# Patient Record
Sex: Male | Born: 1980 | Race: White | Hispanic: No | Marital: Single | Smoking: Former smoker
Health system: Southern US, Community
[De-identification: ages and names within clinical notes are randomized; demographics above are authoritative.]

## PROBLEM LIST (undated history)

## (undated) HISTORY — PX: BACK SURGERY: SHX140

---

## 2016-03-13 ENCOUNTER — Encounter (HOSPITAL_COMMUNITY): Payer: Self-pay | Admitting: Emergency Medicine

## 2016-03-13 ENCOUNTER — Emergency Department (HOSPITAL_COMMUNITY): Payer: Worker's Compensation | Admitting: Anesthesiology

## 2016-03-13 ENCOUNTER — Inpatient Hospital Stay (HOSPITAL_COMMUNITY)
Admission: EM | Admit: 2016-03-13 | Discharge: 2016-03-15 | DRG: 252 | Disposition: A | Discharge status: Home / Self Care | Payer: Worker's Compensation | Attending: Vascular Surgery | Admitting: Vascular Surgery

## 2016-03-13 ENCOUNTER — Emergency Department (HOSPITAL_COMMUNITY): Payer: Worker's Compensation

## 2016-03-13 ENCOUNTER — Encounter (HOSPITAL_COMMUNITY)
Admission: EM | Disposition: A | Discharge status: Home / Self Care | Payer: Self-pay | Source: Home / Self Care | Attending: Vascular Surgery

## 2016-03-13 DIAGNOSIS — W12XXXA Fall on and from scaffolding, initial encounter: Secondary | ICD-10-CM | POA: Diagnosis present

## 2016-03-13 DIAGNOSIS — R200 Anesthesia of skin: Secondary | ICD-10-CM | POA: Diagnosis present

## 2016-03-13 DIAGNOSIS — F17200 Nicotine dependence, unspecified, uncomplicated: Secondary | ICD-10-CM | POA: Diagnosis present

## 2016-03-13 DIAGNOSIS — I7776 Dissection of artery of upper extremity: Secondary | ICD-10-CM | POA: Diagnosis not present

## 2016-03-13 DIAGNOSIS — S143XXA Injury of brachial plexus, initial encounter: Secondary | ICD-10-CM | POA: Diagnosis not present

## 2016-03-13 DIAGNOSIS — S46212A Strain of muscle, fascia and tendon of other parts of biceps, left arm, initial encounter: Secondary | ICD-10-CM | POA: Diagnosis not present

## 2016-03-13 DIAGNOSIS — S4992XA Unspecified injury of left shoulder and upper arm, initial encounter: Secondary | ICD-10-CM

## 2016-03-13 DIAGNOSIS — S45092A Other specified injury of axillary artery, left side, initial encounter: Secondary | ICD-10-CM | POA: Diagnosis not present

## 2016-03-13 DIAGNOSIS — S45009A Unspecified injury of axillary artery, unspecified side, initial encounter: Secondary | ICD-10-CM | POA: Diagnosis present

## 2016-03-13 DIAGNOSIS — S45119A Laceration of brachial artery, unspecified side, initial encounter: Secondary | ICD-10-CM | POA: Diagnosis present

## 2016-03-13 DIAGNOSIS — S4990XA Unspecified injury of shoulder and upper arm, unspecified arm, initial encounter: Secondary | ICD-10-CM | POA: Diagnosis present

## 2016-03-13 HISTORY — PX: AXILLARY-FEMORAL BYPASS GRAFT: SHX894

## 2016-03-13 LAB — CBC WITH DIFFERENTIAL/PLATELET
BASOS ABS: 0.1 10*3/uL (ref 0.0–0.1)
Basophils Relative: 1 %
EOS PCT: 0 %
Eosinophils Absolute: 0.1 10*3/uL (ref 0.0–0.7)
HCT: 42.6 % (ref 39.0–52.0)
HEMOGLOBIN: 14.6 g/dL (ref 13.0–17.0)
LYMPHS PCT: 27 %
Lymphs Abs: 4.2 10*3/uL — ABNORMAL HIGH (ref 0.7–4.0)
MCH: 33.3 pg (ref 26.0–34.0)
MCHC: 34.3 g/dL (ref 30.0–36.0)
MCV: 97 fL (ref 78.0–100.0)
Monocytes Absolute: 1.2 10*3/uL — ABNORMAL HIGH (ref 0.1–1.0)
Monocytes Relative: 8 %
NEUTROS PCT: 64 %
Neutro Abs: 10.1 10*3/uL — ABNORMAL HIGH (ref 1.7–7.7)
Platelets: 268 10*3/uL (ref 150–400)
RBC: 4.39 MIL/uL (ref 4.22–5.81)
RDW: 14.6 % (ref 11.5–15.5)
WBC: 15.7 10*3/uL — AB (ref 4.0–10.5)

## 2016-03-13 LAB — I-STAT CHEM 8, ED
BUN: 14 mg/dL (ref 6–20)
CHLORIDE: 106 mmol/L (ref 101–111)
Calcium, Ion: 1.13 mmol/L (ref 1.13–1.30)
Creatinine, Ser: 0.8 mg/dL (ref 0.61–1.24)
GLUCOSE: 100 mg/dL — AB (ref 65–99)
HEMATOCRIT: 39 % (ref 39.0–52.0)
HEMOGLOBIN: 13.3 g/dL (ref 13.0–17.0)
POTASSIUM: 3.9 mmol/L (ref 3.5–5.1)
SODIUM: 139 mmol/L (ref 135–145)
TCO2: 26 mmol/L (ref 0–100)

## 2016-03-13 LAB — TYPE AND SCREEN
ABO/RH(D): A POS
ANTIBODY SCREEN: NEGATIVE

## 2016-03-13 LAB — ABO/RH: ABO/RH(D): A POS

## 2016-03-13 SURGERY — CREATION, BYPASS, ARTERIAL, AXILLARY TO BILATERAL FEMORAL, USING GRAFT
Anesthesia: General | Laterality: Left

## 2016-03-13 MED ORDER — FENTANYL CITRATE (PF) 100 MCG/2ML IJ SOLN
INTRAMUSCULAR | Status: AC
  Filled 2016-03-13: qty 2

## 2016-03-13 MED ORDER — ONDANSETRON HCL 4 MG/2ML IJ SOLN
INTRAMUSCULAR | Status: AC
  Filled 2016-03-13: qty 2

## 2016-03-13 MED ORDER — PROMETHAZINE HCL 25 MG/ML IJ SOLN
6.2500 mg | INTRAMUSCULAR | Status: DC | PRN
  Administered 2016-03-13: 6.25 mg via INTRAVENOUS

## 2016-03-13 MED ORDER — IOPAMIDOL (ISOVUE-370) INJECTION 76%
INTRAVENOUS | Status: AC
  Administered 2016-03-13: 100 mL
  Filled 2016-03-13: qty 100

## 2016-03-13 MED ORDER — HYDROMORPHONE HCL 1 MG/ML IJ SOLN
1.0000 mg | INTRAMUSCULAR | Status: AC
  Administered 2016-03-13: 1 mg via INTRAVENOUS
  Filled 2016-03-13: qty 1

## 2016-03-13 MED ORDER — HYDROMORPHONE HCL 1 MG/ML IJ SOLN
1.0000 mg | Freq: Once | INTRAMUSCULAR | Status: AC
  Administered 2016-03-13: 1 mg via INTRAVENOUS
  Filled 2016-03-13: qty 1

## 2016-03-13 MED ORDER — PROPOFOL 10 MG/ML IV BOLUS
INTRAVENOUS | Status: DC | PRN
  Administered 2016-03-13: 200 mg via INTRAVENOUS

## 2016-03-13 MED ORDER — MEPERIDINE HCL 25 MG/ML IJ SOLN: 6.2500 mg | INTRAMUSCULAR | Status: DC | PRN

## 2016-03-13 MED ORDER — SODIUM CHLORIDE 0.9 % IV SOLN
INTRAVENOUS | Status: DC | PRN
  Administered 2016-03-13: 500 mL

## 2016-03-13 MED ORDER — CEFAZOLIN SODIUM-DEXTROSE 2-3 GM-% IV SOLR
INTRAVENOUS | Status: DC | PRN
  Administered 2016-03-13: 2 g via INTRAVENOUS

## 2016-03-13 MED ORDER — PROMETHAZINE HCL 25 MG/ML IJ SOLN
INTRAMUSCULAR | Status: AC
  Filled 2016-03-13: qty 1

## 2016-03-13 MED ORDER — ONDANSETRON HCL 4 MG/2ML IJ SOLN
4.0000 mg | Freq: Once | INTRAMUSCULAR | Status: AC
  Administered 2016-03-13: 4 mg via INTRAVENOUS
  Filled 2016-03-13: qty 2

## 2016-03-13 MED ORDER — MIDAZOLAM HCL 2 MG/2ML IJ SOLN
INTRAMUSCULAR | Status: DC | PRN
  Administered 2016-03-13: 2 mg via INTRAVENOUS

## 2016-03-13 MED ORDER — HEPARIN SODIUM (PORCINE) 1000 UNIT/ML IJ SOLN
INTRAMUSCULAR | Status: AC
  Filled 2016-03-13: qty 1

## 2016-03-13 MED ORDER — MIDAZOLAM HCL 2 MG/2ML IJ SOLN
INTRAMUSCULAR | Status: AC
  Filled 2016-03-13: qty 2

## 2016-03-13 MED ORDER — LIDOCAINE HCL (CARDIAC) 20 MG/ML IV SOLN
INTRAVENOUS | Status: DC | PRN
  Administered 2016-03-13: 100 mg via INTRATRACHEAL

## 2016-03-13 MED ORDER — HEMOSTATIC AGENTS (NO CHARGE) OPTIME
TOPICAL | Status: DC | PRN
  Administered 2016-03-13: 1 via TOPICAL

## 2016-03-13 MED ORDER — FENTANYL CITRATE (PF) 100 MCG/2ML IJ SOLN
INTRAMUSCULAR | Status: DC | PRN
  Administered 2016-03-13: 100 ug via INTRAVENOUS
  Administered 2016-03-13 (×2): 50 ug via INTRAVENOUS
  Administered 2016-03-13: 100 ug via INTRAVENOUS
  Administered 2016-03-13 (×2): 50 ug via INTRAVENOUS

## 2016-03-13 MED ORDER — 0.9 % SODIUM CHLORIDE (POUR BTL) OPTIME
TOPICAL | Status: DC | PRN
  Administered 2016-03-13: 2000 mL

## 2016-03-13 MED ORDER — PROTAMINE SULFATE 10 MG/ML IV SOLN
INTRAVENOUS | Status: DC | PRN
  Administered 2016-03-13: 50 mg via INTRAVENOUS

## 2016-03-13 MED ORDER — LACTATED RINGERS IV SOLN
INTRAVENOUS | Status: DC | PRN
  Administered 2016-03-13 (×2): via INTRAVENOUS

## 2016-03-13 MED ORDER — HYDROMORPHONE HCL 1 MG/ML IJ SOLN
INTRAMUSCULAR | Status: AC
  Filled 2016-03-13: qty 1

## 2016-03-13 MED ORDER — ONDANSETRON HCL 4 MG/2ML IJ SOLN
INTRAMUSCULAR | Status: DC | PRN
  Administered 2016-03-13: 4 mg via INTRAVENOUS

## 2016-03-13 MED ORDER — HEPARIN SODIUM (PORCINE) 1000 UNIT/ML IJ SOLN
INTRAMUSCULAR | Status: DC | PRN
  Administered 2016-03-13: 9000 [IU] via INTRAVENOUS
  Administered 2016-03-13: 2000 [IU] via INTRAVENOUS

## 2016-03-13 MED ORDER — HYDROMORPHONE HCL 1 MG/ML IJ SOLN
0.2500 mg | INTRAMUSCULAR | Status: DC | PRN
  Administered 2016-03-13 – 2016-03-14 (×4): 0.5 mg via INTRAVENOUS

## 2016-03-13 MED ORDER — SUCCINYLCHOLINE CHLORIDE 20 MG/ML IJ SOLN
INTRAMUSCULAR | Status: DC | PRN
  Administered 2016-03-13: 100 mg via INTRAVENOUS

## 2016-03-13 MED ORDER — PROTAMINE SULFATE 10 MG/ML IV SOLN
INTRAVENOUS | Status: AC
  Filled 2016-03-13: qty 5

## 2016-03-13 SURGICAL SUPPLY — 47 items
CANISTER SUCTION 2500CC (MISCELLANEOUS) ×3 IMPLANT
CLIP TI MEDIUM 24 (CLIP) ×3 IMPLANT
CLIP TI WIDE RED SMALL 24 (CLIP) ×3 IMPLANT
CLOSURE WOUND 1/2 X4 (GAUZE/BANDAGES/DRESSINGS)
DERMABOND ADVANCED (GAUZE/BANDAGES/DRESSINGS) ×4
DERMABOND ADVANCED .7 DNX12 (GAUZE/BANDAGES/DRESSINGS) ×2 IMPLANT
DRAIN SNY 10X20 3/4 PERF (WOUND CARE) IMPLANT
DRAPE INCISE IOBAN 66X45 STRL (DRAPES) ×3 IMPLANT
DRAPE UTILITY W/TAPE 26X15 (DRAPES) ×3 IMPLANT
DRSG COVADERM 4X6 (GAUZE/BANDAGES/DRESSINGS) ×3 IMPLANT
DRSG COVADERM 4X8 (GAUZE/BANDAGES/DRESSINGS) ×3 IMPLANT
ELECT REM PT RETURN 9FT ADLT (ELECTROSURGICAL) ×3
ELECTRODE REM PT RTRN 9FT ADLT (ELECTROSURGICAL) ×1 IMPLANT
EVACUATOR SILICONE 100CC (DRAIN) IMPLANT
GAUZE SPONGE 4X4 12PLY STRL (GAUZE/BANDAGES/DRESSINGS) ×3 IMPLANT
GAUZE SPONGE 4X4 16PLY XRAY LF (GAUZE/BANDAGES/DRESSINGS) IMPLANT
GLOVE BIO SURGEON STRL SZ7 (GLOVE) ×3 IMPLANT
GLOVE BIOGEL PI IND STRL 6.5 (GLOVE) ×4 IMPLANT
GLOVE BIOGEL PI IND STRL 7.5 (GLOVE) ×2 IMPLANT
GLOVE BIOGEL PI INDICATOR 6.5 (GLOVE) ×8
GLOVE BIOGEL PI INDICATOR 7.5 (GLOVE) ×4
GLOVE OPTIFIT SS 6.5 STRL BRWN (GLOVE) ×3 IMPLANT
GLOVE SURG SS PI 7.5 STRL IVOR (GLOVE) ×3 IMPLANT
GOWN STRL REUS W/ TWL LRG LVL3 (GOWN DISPOSABLE) ×3 IMPLANT
GOWN STRL REUS W/TWL LRG LVL3 (GOWN DISPOSABLE) ×6
HEMOSTAT SPONGE AVITENE ULTRA (HEMOSTASIS) ×3 IMPLANT
INSERT FOGARTY SM (MISCELLANEOUS) IMPLANT
KIT BASIN OR (CUSTOM PROCEDURE TRAY) ×3 IMPLANT
KIT ROOM TURNOVER OR (KITS) ×3 IMPLANT
NS IRRIG 1000ML POUR BTL (IV SOLUTION) ×6 IMPLANT
PACK PERIPHERAL VASCULAR (CUSTOM PROCEDURE TRAY) ×3 IMPLANT
PAD ARMBOARD 7.5X6 YLW CONV (MISCELLANEOUS) ×6 IMPLANT
STAPLER VISISTAT 35W (STAPLE) IMPLANT
STRIP CLOSURE SKIN 1/2X4 (GAUZE/BANDAGES/DRESSINGS) IMPLANT
SUT CHROMIC 2 0 SH (SUTURE) ×3 IMPLANT
SUT MNCRL AB 4-0 PS2 18 (SUTURE) ×3 IMPLANT
SUT PROLENE 5 0 C 1 24 (SUTURE) ×3 IMPLANT
SUT PROLENE 6 0 BV (SUTURE) ×9 IMPLANT
SUT SILK 2 0 FS (SUTURE) IMPLANT
SUT SILK 3 0 (SUTURE)
SUT SILK 3-0 18XBRD TIE 12 (SUTURE) IMPLANT
SUT VIC AB 2-0 CT1 27 (SUTURE) ×6
SUT VIC AB 2-0 CT1 TAPERPNT 27 (SUTURE) ×3 IMPLANT
SUT VIC AB 3-0 SH 27 (SUTURE) ×2
SUT VIC AB 3-0 SH 27X BRD (SUTURE) ×1 IMPLANT
TRAY FOLEY W/METER SILVER 16FR (SET/KITS/TRAYS/PACK) IMPLANT
WATER STERILE IRR 1000ML POUR (IV SOLUTION) ×3 IMPLANT

## 2016-03-13 NOTE — H&P (View-Only) (Signed)
Referred by:  Dr. Dalene SeltzerSchlossman ([facility])  Reason for referral: left axillary artery dissection with thrombosis   History of Present Illness  Oscar BandaStanley Peterson is a 35 y.o. (20-Nov-1980) male RHD who presents with chief complaint: severe pain left armpit.  This patient fell on to a scaffolding rail around 11 am, catching it with his left armpit.  He held onto the scaffolding while it proceeded to collapses.  He notes severe sharp pain in left arm pit with numbness from elbow down to hand.  Pt denies any prior left arm injuries.    Past Medical History: usually healthy  Past Surgical History:  Procedure Laterality Date  . BACK SURGERY      Social History   Social History  . Marital status: Single    Spouse name: N/A  . Number of children: N/A  . Years of education: N/A   Occupational History  . Not on file.   Social History Main Topics  . Smoking status: Current Every Day Smoker  . Smokeless tobacco: Never Used  . Alcohol use No  . Drug use: No  . Sexual activity: Not on file   Other Topics Concern  . Not on file   Social History Narrative  . No narrative on file    Family History: patient is unable to detail the medical history of his parents (mostly healthy)   Current Facility-Administered Medications  Medication Dose Route Frequency Provider Last Rate Last Dose  . HYDROmorphone (DILAUDID) injection 1 mg  1 mg Intravenous Q1 Hr x 2 Tatyana Kirichenko, PA-C   1 mg at 03/13/16 1805   No current outpatient prescriptions on file.     No Known Allergies   REVIEW OF SYSTEMS:  (Positives checked otherwise negative)  CARDIOVASCULAR:   [ ]  chest pain,  [ ]  chest pressure,  [ ]  palpitations,  [ ]  shortness of breath when laying flat,  [ ]  shortness of breath with exertion,   [ ]  pain in feet when walking,  [ ]  pain in feet when laying flat, [ ]  history of blood clot in veins (DVT),  [ ]  history of phlebitis,  [ ]  swelling in legs,  [ ]  varicose  veins  PULMONARY:   [ ]  productive cough,  [ ]  asthma,  [ ]  wheezing  NEUROLOGIC:   [x]  weakness in arms or legs,  [x]  numbness in arms or legs,  [ ]  difficulty speaking or slurred speech,  [ ]  temporary loss of vision in one eye,  [ ]  dizziness  HEMATOLOGIC:   [ ]  bleeding problems,  [ ]  problems with blood clotting too easily  MUSCULOSKEL:   [ ]  joint pain, [ ]  joint swelling  GASTROINTEST:   [ ]  vomiting blood,  [ ]  blood in stool     GENITOURINARY:   [ ]  burning with urination,  [ ]  blood in urine  PSYCHIATRIC:   [ ]  history of major depression  INTEGUMENTARY:   [ ]  rashes,  [ ]  ulcers  CONSTITUTIONAL:   [ ]  fever,  [ ]  chills   Physical Examination  Vitals:   03/13/16 1156 03/13/16 1230 03/13/16 1630 03/13/16 1730  BP: 144/85 133/92 137/92 150/87  Pulse:   70 71  Resp:      Temp:      TempSrc:      SpO2:   95% 100%  Weight:      Height:       Body mass index is 31.32  kg/m.  General: A&O x 3, WDWN  Head: /AT  Ear/Nose/Throat: Hearing grossly intact, nares w/o erythema or drainage, oropharynx w/o Erythema/Exudate, Mallampati score: 3  Eyes: PERRLA, EOMI  Neck: Supple, no nuchal rigidity, no palpable LAD  Pulmonary: Sym exp, good air movt, CTAB, no rales, rhonchi, & wheezing  Cardiac: RRR, Nl S1, S2, no Murmurs, rubs or gallops  Vascular: Vessel Right Left  Radial Palpable Not Palpable  Brachial Palpable Faintly Palpable  Carotid Palpable, without bruit Palpable, without bruit  Aorta Not palpable N/A  Femoral Palpable Palpable  Popliteal Not palpable Not palpable  PT Palpable Palpable  DP Palpable Palpable   Gastrointestinal: soft, NTND, no G/R, no HSM, no masses, no CVAT B  Musculoskeletal: M/S 5/5 throughout except L arm unable test due to pain L axilla, L hand grip 1-2/5, obvious linear abrasion in left axilla extending to posterior shoulder, Extremities without ischemic changes, viable appear L hand  Neurologic: CN 2-12  grossly, intact Pain and light touch intact in extremities except L hand and forearm, Motor exam as listed above  Psychiatric: Judgment intact, Mood & affect appropriate for pt's clinical situation  Dermatologic: See M/S exam for extremity exam, no rashes otherwise noted  Lymph : No Cervical, Axillary, or Inguinal lymphadenopathy   Laboratory: CBC:    Component Value Date/Time   WBC 15.7 (H) 03/13/2016 1742   RBC 4.39 03/13/2016 1742   HGB 14.6 03/13/2016 1742   HCT 42.6 03/13/2016 1742   PLT 268 03/13/2016 1742   MCV 97.0 03/13/2016 1742   MCH 33.3 03/13/2016 1742   MCHC 34.3 03/13/2016 1742   RDW 14.6 03/13/2016 1742   LYMPHSABS 4.2 (H) 03/13/2016 1742   MONOABS 1.2 (H) 03/13/2016 1742   EOSABS 0.1 03/13/2016 1742   BASOSABS 0.1 03/13/2016 1742    BMP:    Component Value Date/Time   NA 139 03/13/2016 1456   K 3.9 03/13/2016 1456   CL 106 03/13/2016 1456   GLUCOSE 100 (H) 03/13/2016 1456   BUN 14 03/13/2016 1456   CREATININE 0.80 03/13/2016 1456    Coagulation: No results found for: INR, PROTIME No results found for: PTT  Lipids: No results found for: CHOL, TRIG, HDL, CHOLHDL, VLDL, LDLCALC, LDLDIRECT  Radiology: Dg Forearm Left  Result Date: 03/13/2016 CLINICAL DATA:  Injury.  Left forearm pain. EXAM: LEFT FOREARM - 2 VIEW COMPARISON:  None. FINDINGS: There is no evidence of fracture or other focal bone lesions. Soft tissues are unremarkable. IMPRESSION: Negative. Electronically Signed   By: Amie Portland M.D.   On: 03/13/2016 13:41   Ct Angio Up Extrem Left W &/or Wo Contast  Result Date: 03/13/2016 CLINICAL DATA:  Larey Seat from scaffolding, left axillary injury with left arm and hand numbness and paresthesias. EXAM: CT ANGIOGRAPHY UPPER LEFT EXTREMITY TECHNIQUE: CT angiography technique performed of the left upper extremity a with IV contrast. Coronal and sagittal reconstructions performed. CONTRAST:  100 cc Isovue 370 COMPARISON:  None. FINDINGS: Imaging beginning  at the left chest wall, the peripheral left subclavian artery is patent. Left axillary artery demonstrates a focal area of of occlusion compatible with acute traumatic vascular injury consistent with a focal dissection and thrombosis. Peripheral to this, the brachial artery reconstitutes and is normal in appearance. At the elbow, the radial, interosseous, and ulnar arteries are patent into the forearm. Branches of the palmar arch into the hand are patent. No visualized distal emboli within the left upper extremity to the level of the wrist. No significant soft  tissue hematoma. No other musculoskeletal abnormality. Review of the MIP images confirms the above findings. 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 Left brachial artery is reconstituted supplying the radial, interosseous and ulnar arteries. These results were called by telephone at the time of interpretation on 03/13/2016 at 5:23 pm to Dr. Jaynie CrumbleATYANA KIRICHENKO , who verbally acknowledged these results. Electronically Signed   By: Judie PetitM.  Shick M.D.   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 Pain in left shoulder joint down through elbow to forearm with numbness in left forearm and hand. Bruising in Left arm pit. EXAM: LEFT SHOULDER - 2+ VIEW COMPARISON:  None. FINDINGS: There is no evidence of fracture or dislocation. There is no evidence of arthropathy or other focal bone abnormality. Soft tissues are unremarkable. IMPRESSION: Negative. Electronically Signed   By: Amie Portlandavid  Ormond M.D.   On: 03/13/2016 13:40    I reviewed the LUE CTA, there is focal thrombosis of axillary artery without any associated hematoma and reconstitution of brachial artery with intact flow to radial and ulnar arteries.   Medical Decision Making  Oscar BandaStanley  Raineri is a 35 y.o. male who presents with: traction injury to left axillary artery with likely intimal disruption from dissection leading to thrombosis.   Pt will need emergent: exploration of L axilla, repair of L axillary artery, other indicated procedure.  The patient is aware that an interposition graft might be needed, which would be harvested from GSV. The risk, benefits, and alternative for the proposed operation were discussed with the patient.   The patient is aware the risks include but are not limited to: bleeding, infection, myocardial infarction, stroke, limb loss, nerve damage, need for additional procedures in the future, wound complications, and inability to complete the revascularization. The patient is aware of these risks and agreed to proceed.  The degree of numbness in forearm and hand suggest likely concomitant neurologic injury also.  Further evaluation of nerve function and ROM will have to wait reconstruction of the artery.  Thank you for allowing us to participate in this patient's care.   Leonides SakeBrian Brilee Port, MD [facility] Office: [phone] Pager: [phone]  03/13/2016, 6:05 PM

## 2016-03-13 NOTE — Interval H&P Note (Signed)
History and Physical Interval Note:  03/13/2016 6:16 PM  Oscar Peterson  has presented today for surgery, with the diagnosis of BRACHIAL PLEXUS INJURY  The various methods of treatment have been discussed with the patient and family. After consideration of risks, benefits and other options for treatment, the patient has consented to  Procedure(s): AXILLA EXPLORATION REPAIR AXILLA ARTERY (Left) as a surgical intervention .  The patient's history has been reviewed, patient examined, no change in status, stable for surgery.  I have reviewed the patient's chart and labs.  Questions were answered to the patient's satisfaction.     Leonides SakeBrian Chen

## 2016-03-13 NOTE — Transfer of Care (Signed)
Immediate Anesthesia Transfer of Care Note  Patient: Oscar Peterson  Procedure(s) Performed: Procedure(s): Interposition Graft Repair Axillary Artery using Greater Saphenous Vein Graft from Left Thigh; Evacuation of Hematoma Left Axilla (Left)  Patient Location: PACU  Anesthesia Type:General  Level of Consciousness: awake, alert  and oriented  Airway & Oxygen Therapy: Patient connected to nasal cannula oxygen  Post-op Assessment: Report given to RN and Post -op Vital signs reviewed and stable  Post vital signs: Reviewed and stable  Last Vitals:  Vitals:   03/13/16 1807 03/13/16 2220  BP: 163/92 (!) (P) 140/96  Pulse: 63 (P) 70  Resp:  (P) 14  Temp:  (P) 36.7 C    Last Pain:  Vitals:   03/13/16 1300  TempSrc:   PainSc: 10-Worst pain ever         Complications: No apparent anesthesia complications

## 2016-03-13 NOTE — Op Note (Signed)
OPERATIVE NOTE   PROCEDURE: 1. Left axillary exploration 2. Evacuation of left axillary hematoma 3. Repair of axillary artery with reversed left greater saphenous vein interposition graft  PRE-OPERATIVE DIAGNOSIS: left axillary artery injury with thrombosis  POST-OPERATIVE DIAGNOSIS: same as above   SURGEON: Leonides SakeBrian Aliyanah Rozas, MD  ASSISTANT(S): RNFA  ANESTHESIA: general  ESTIMATED BLOOD LOSS: 200 cc  FINDING(S): 1.  Intimal disruption of distal axillary artery with associated thrombosis 2.  Intact brachial perfusion via collateral flow 3.  Rupture of anterior belly of bicep muscle 4.  No frank venous or nerve injury  SPECIMEN(S):  none  INDICATIONS:  Oscar Peterson is a 35 y.o. male who presents with traumatic injury of left axillary artery after fall onto left arm pit.  I recommended: exploration of L axilla, repair of L axillary artery, other indicated procedure.  The patient is aware that an interposition graft might be needed, which would be harvested from GSV.  The risk, benefits, and alternative for the proposed operation were discussed with the patient.  The patient is aware the risks include but are not limited to: bleeding, infection, myocardial infarction, stroke, limb loss, nerve damage, need for additional procedures in the future, wound complications, and inability to complete the revascularization.  The patient is aware of these risks and agreed to proceed.  DESCRIPTION: After obtaining full informed written consent, the patient was brought back to the operating room and placed supine upon the operating table.  The patient received IV antibiotics prior to induction.  After obtaining adequate anesthesia, the patient was prepped and draped in the standard fashion for: Left arm bypass and left greater saphenous vein harvest. First I interrogated the left arm with the SonoSite marking the position of the distal axillary artery and proximal brachial artery.  Then I made a  longitudinal incision over the distal axillary artery and proximal brachial artery.  I dissected through the subcutaneous tissue with electrocautery. In this process I dissected down to the fascia.  Immediately upon opening the fascia hematoma was evident in the axilla.  I dissected out this hematoma bluntly.  In this process, I entered into a large cavity that was superior to the neurovascular bundle in the axilla.  There was an obvious rupture of the anterior belly of the bicep muscle.  I packed this cavity was a sterile Ray-Tec to [facility] not the wound. He then dissected away the median antecubital sensory trunk and a medial brachial vein.  This then revealed the position of the median nerve.  There was hematoma immediately adherent to the median nerve.  There was no evidence of injury to either the median nerve or the sensory trunk.  Note, the sensory trunk was immediately superficial to the median nerve and artery.  I dissected out approximately a 10 cm segment of the median nerve, taking care to avoid injury to the nerve. This gave me adequate laxity in the nerve to retract it out of the way of injury. At this point I bluntly dissected out the distal axillary artery and proximal brachial artery.  It in the middle of this dissected segment I found obvious evidence of thrombosis of the distal axillary artery without any frank rupture of this segment of artery. I dissected proximal to this segment until I found a healthy segment of the artery that had a pulse.  Distally, I dissected down to a segment of brachial artery that had a doppler signal.    The patient was given 9000 units of Heparin  intravenously, which was a therapeutic bolus. An additional 2000 units of Heparin was administered every hour after initial bolus to maintain anticoagulation.  In total, [id number] units of Heparin was administrated to achieve and maintain a therapeutic level of anticoagulation.  I clamped the distal axillary artery and the  proximal brachial artery.  I made an arteriotomy with a 11-blade in the thrombosed segment.  Almost immediately it was evident that not only was there thrombus but also also disrupted intima.  I extended this arteriotomy proximally and distally until I reach segments with intact intima.  Essentially for a 2-3 cm of axillary artery had complete loss of intima.  The residual posterior wall was compromised, so I felt that replacement of this segment with an interposition graft was going to be necessary.  I turned my attention to the left groin.  Under Sonosite guidance, I found the greater saphenous vein.  I made an 10  cm incision over the vein distal to the saphenofemoral junction.  I dissected down to the vein with electrocautery.  In this process, I tied off side branches with 4-0 silk.  I clamped the vein distally and transected the vein.  I clamped the residual greater saphenous vein with 2 medium clips.  I then tied off the vein just distal to the saphenofemoral junction.  I then place a vessel cannula in the distal end of this vein.  I tied this in place with a 2-0 silk.  I hydrodistended this vein.  There was no obvious leaks and the vein appeared to be 3-4 mm in diameter.    At this point, I cut off the injured segment of brachial vein.  I spatulated the proximal end of vein and sewed this vein to artery with a running stitch of 6-0 Prolene in an end-to-end configuration.  I released the clamp on the brachial artery and allowed the vein to backbleed.  I elected to go ahead an lyse the valves in this conduit.  I passed a valvulotome down this vein, lysing only a few valves.  I marked the anterior surface of this conduit.   At this point, I cut off the injured segment of the axillary artery.  I completely dissected out the injured segment of thee artery.  I tied off a thrombosed side branch and passed off this injured segment off the field.  I spatulated the distal end of the conduit to meet the dimension  of the distal axillary artery.  The vein was sewn to the artery in an end-to-end fashion with 6-0 Prolene.  Prior to completing this anastomosis, I backbled both ends of the artery: no thrombus was noted.  I completed this anastomosis in the usual fashion.  After releasing the clamps, immediately there was a palpable pulse in the brachial artery.  There was multiphasic signals in the brachial, radial and ulnar arteries.  I gave the patient 50 mg of Protamine to reverse anticoagulation.  At this point, I packed the thigh incision and axillary incision with Avitene.  After hold pressure for a few minutes, I turned my attention to the left thigh.  I controlled bleeding points with electrocautery.  I reapproximated the subcutaneous tissue with a double layer of 2-0 Vicryl.  The skin was reapproximated with a running subcuticular of 4-0 Monocryl.  The skin was cleaned, dried, and reinforced with Dermabond.  The incision was covered with a Coverderm.  I then turned my attention to the axilla.  No further bleeding was noted.  I  tried briefly reapproximate the anterior muscle belly of the biceps but the proximal residual muscle would not hold stitches.  I reapproximated the fascia overlying this belly with a running stitch of 2-0 Vicryl.  I reapproximated the fascia in this axilla with a 2-0 Vicryl.  The subcutaneous tissue was then reapproximated with a double layer of 2-0 Vicryl.  The skin was reapproximated with a running subcuticular of 4-0 Monocryl.  The skin was cleaned, dried, and reinforced with Dermabond.  The incision was covered with a Coverderm.  At the end of the case, I rechecked the distal signals.  There remained biphasic brachial, radial and ulnar artery signals.   COMPLICATIONS: none  CONDITION: stable   Leonides Sake, MD [facility] [place] Office: [phone] Pager: [phone]  03/13/2016, 10:06 PM

## 2016-03-13 NOTE — ED Provider Notes (Signed)
[facility]-EMERGENCY DEPT Provider Note   CSN: [id number] Arrival date & time: 03/13/16  1136     History   Chief Complaint Chief Complaint  Patient presents with  . Arm Injury    HPI Oscar Peterson is a 35 y.o. male.  HPI Oscar Peterson is a 35 y.o. male with history of prior back injury, presents to emergency department after a fall. Patient states he was at work, standing on a 7 foot scaffolding, when it broke and he fell down through it. He states that he caught himself under his right arm, right on the axilla, on the remainder of scaffolding. He states then he fell to the ground landing on the right side. He denies hitting his head or loss of consciousness. He denies any other injuries besides left shoulder and axilla. He states that initially after the incident his entire left arm went numb. He states since then some of the filling has come back, but states the numbness comes and goes. He also reports weakness in the same arm. No treatment prior to coming to the emergency department. No prior injuries to the arm. No neck pain or injury.  History reviewed. No pertinent past medical history.  There are no active problems to display for this patient.   Past Surgical History:  Procedure Laterality Date  . BACK SURGERY         Home Medications    Prior to Admission medications   Not on File    Family History No family history on file.  Social History Social History  Substance Use Topics  . Smoking status: Current Every Day Smoker  . Smokeless tobacco: Never Used  . Alcohol use No     Allergies   Review of patient's allergies indicates no known allergies.   Review of Systems Review of Systems  Constitutional: Negative for chills and fever.  Respiratory: Negative for cough, chest tightness and shortness of breath.   Cardiovascular: Negative for chest pain, palpitations and leg swelling.  Gastrointestinal: Negative for abdominal pain.  Musculoskeletal: Positive  for arthralgias and myalgias. Negative for neck pain and neck stiffness.  Skin: Negative for rash.  Allergic/Immunologic: Negative for immunocompromised state.  Neurological: Positive for weakness and numbness. Negative for dizziness, light-headedness and headaches.  All other systems reviewed and are negative.    Physical Exam Updated Vital Signs BP 133/92   Pulse 76   Temp 98.1 F (36.7 C) (Oral)   Resp 20   Ht 5\' 7"  (1.702 m)   Wt 90.7 kg   SpO2 98%   BMI 31.32 kg/m   Physical Exam  Constitutional: He appears well-developed and well-nourished. No distress.  HENT:  Head: Normocephalic and atraumatic.  Eyes: Conjunctivae and EOM are normal. Pupils are equal, round, and reactive to light.  Neck: Neck supple.  Cardiovascular: Normal rate, regular rhythm and normal heart sounds.   Pulmonary/Chest: Effort normal. No respiratory distress. He has no wheezes. He has no rales.  Abdominal: There is no tenderness.  Musculoskeletal: He exhibits no edema.  Bruising to the left axilla. Tenderness to palpation diffusely over left shoulder joint, left triceps, left dorsal forearm. Unable to move left shoulder due to pain. No tenderness of her scapula. No tenderness over the clavicle. Full range of motion at the elbow and wrist passively. Distal radial Doppler and intact, cap refill less than 2 seconds distally.  Neurological: He is alert.  Decreased strength of the biceps, triceps, grip muscles of the left forearm. Decreased sensation in all  dermatomes.  Skin: Skin is warm and dry. Capillary refill takes less than 2 seconds.  Nursing note and vitals reviewed.    ED Treatments / Results  Labs (all labs ordered are listed, but only abnormal results are displayed) Labs Reviewed  I-STAT CHEM 8, ED    EKG  EKG Interpretation None       Radiology Dg Forearm Left  Result Date: 03/13/2016 CLINICAL DATA:  Injury.  Left forearm pain. EXAM: LEFT FOREARM - 2 VIEW COMPARISON:  None.  FINDINGS: There is no evidence of fracture or other focal bone lesions. Soft tissues are unremarkable. IMPRESSION: Negative. Electronically Signed   By: Amie Portlandavid  Ormond M.D.   On: 03/13/2016 13:41   Ct Angio Up Extrem Left W &/or Wo Contast  Result Date: 03/13/2016 CLINICAL DATA:  Larey SeatFell from scaffolding, left axillary injury with left arm and hand numbness and paresthesias. EXAM: CT ANGIOGRAPHY UPPER LEFT EXTREMITY TECHNIQUE: CT angiography technique performed of the left upper extremity a with IV contrast. Coronal and sagittal reconstructions performed. CONTRAST:  100 cc Isovue 370 COMPARISON:  None. FINDINGS: Imaging beginning at the left chest wall, the peripheral left subclavian artery is patent. Left axillary artery demonstrates a focal area of of occlusion compatible with acute traumatic vascular injury consistent with a focal dissection and thrombosis. Peripheral to this, the brachial artery reconstitutes and is normal in appearance. At the elbow, the radial, interosseous, and ulnar arteries are patent into the forearm. Branches of the palmar arch into the hand are patent. No visualized distal emboli within the left upper extremity to the level of the wrist. No significant soft tissue hematoma. No other musculoskeletal abnormality. Review of the MIP images confirms the above findings. 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 Left brachial artery is reconstituted supplying the radial, interosseous and ulnar arteries. These results were called by telephone at the time of interpretation on 03/13/2016 at 5:23 pm to Dr. Jaynie CrumbleATYANA Philomene Haff , who verbally acknowledged these results. Electronically Signed   By: Judie PetitM.  Shick M.D.   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 Pain in left shoulder  joint down through elbow to forearm with numbness in left forearm and hand. Bruising in Left arm pit. EXAM: LEFT SHOULDER - 2+ VIEW COMPARISON:  None. FINDINGS: There is no evidence of fracture or dislocation. There is no evidence of arthropathy or other focal bone abnormality. Soft tissues are unremarkable. IMPRESSION: Negative. Electronically Signed   By: Amie Portlandavid  Ormond M.D.   On: 03/13/2016 13:40    Procedures Procedures (including critical care time)  Medications Ordered in ED Medications  HYDROmorphone (DILAUDID) injection 1 mg (1 mg Intravenous Given 03/13/16 1332)  ondansetron (ZOFRAN) injection 4 mg (4 mg Intravenous Given 03/13/16 1332)  iopamidol (ISOVUE-370) 76 % injection (100 mLs  Contrast Given 03/13/16 1540)  HYDROmorphone (DILAUDID) injection 1 mg (1 mg Intravenous Given 03/13/16 1730)     Initial Impression / Assessment and Plan / ED Course  I have reviewed the triage vital signs and the nursing notes.  Pertinent labs & imaging results that were available during my care of the patient were reviewed by me and considered in my medical decision making (see chart for details).  Clinical Course   1:52 PM xrays negative. Radial pulse weak, but palpable with dopler. Cap refill <2  sec. Discussed with Dr. Dalene SeltzerSchlossman. Given possible vascular injury, will get ct angio for evaluaton  5:29 PM CT agio resulted concerning for acute injury to the left axillary artery. I went to update pt. He is aggitated bc he has not received his pain medications from when I ordered it an hour ago. Explained that nurse may be busy. Pt stated that he asked for pain medication hour before I even saw him and no one related anything to me. Pt wanting to leave. Explained to him his injury is limb threatening. He is willing to stay. Signed out to Dr. Jeraldine LootsLockwood pending vascular consult. Pt just received his pain medication.    Final Clinical Impressions(s) / ED Diagnoses   Final diagnoses:  Brachial plexus  injury    New Prescriptions New Prescriptions   No medications on file     Jaynie Crumbleatyana Leith Szafranski, PA-C 03/15/16 1027    Alvira MondayErin Schlossman, MD 03/18/16 1335

## 2016-03-13 NOTE — Anesthesia Procedure Notes (Signed)
Procedure Name: Intubation Date/Time: 03/13/2016 7:02 PM Performed by: Molli HazardGORDON, Magdeline Prange M Pre-anesthesia Checklist: Patient identified, Emergency Drugs available, Suction available and Patient being monitored Patient Re-evaluated:Patient Re-evaluated prior to inductionOxygen Delivery Method: Circle system utilized Preoxygenation: Pre-oxygenation with 100% oxygen Intubation Type: IV induction, Rapid sequence and Cricoid Pressure applied Laryngoscope Size: Miller and 2 Grade View: Grade I Tube type: Oral Number of attempts: 1 Airway Equipment and Method: Stylet Placement Confirmation: ETT inserted through vocal cords under direct vision,  positive ETCO2 and breath sounds checked- equal and bilateral Secured at: 23 cm Tube secured with: Tape Dental Injury: Teeth and Oropharynx as per pre-operative assessment

## 2016-03-13 NOTE — Progress Notes (Signed)
Orthopedic Tech Progress Note Patient Details:  Oscar Peterson 1980-12-20 [id number]  Ortho Devices Type of Ortho Device: Sling immobilizer Ortho Device/Splint Location: lue Ortho Device/Splint Interventions: Application   [name] 03/13/2016, 2:32 PM As ordered by Dr. Dalene SeltzerSchlossman

## 2016-03-13 NOTE — ED Triage Notes (Signed)
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 Pt has abrasion up under left arm and is complaining of pain and numbness to left arm

## 2016-03-13 NOTE — Consult Note (Signed)
  Referred by:  Dr. Schlossman (ED [facility])  Reason for referral: left axillary artery dissection with thrombosis   History of Present Illness  Oscar Peterson is a 34 y.o. (05/04/1981) male RHD who presents with chief complaint: severe pain left armpit.  This patient fell on to a scaffolding rail around 11 am, catching it with his left armpit.  He held onto the scaffolding while it proceeded to collapses.  He notes severe sharp pain in left arm pit with numbness from elbow down to hand.  Pt denies any prior left arm injuries.    Past Medical History: usually healthy  Past Surgical History:  Procedure Laterality Date  . BACK SURGERY      Social History   Social History  . Marital status: Single    Spouse name: N/A  . Number of children: N/A  . Years of education: N/A   Occupational History  . Not on file.   Social History Main Topics  . Smoking status: Current Every Day Smoker  . Smokeless tobacco: Never Used  . Alcohol use No  . Drug use: No  . Sexual activity: Not on file   Other Topics Concern  . Not on file   Social History Narrative  . No narrative on file    Family History: patient is unable to detail the medical history of his parents (mostly healthy)   Current Facility-Administered Medications  Medication Dose Route Frequency Provider Last Rate Last Dose  . HYDROmorphone (DILAUDID) injection 1 mg  1 mg Intravenous Q1 Hr x 2 Tatyana Kirichenko, PA-C   1 mg at 03/13/16 1805   No current outpatient prescriptions on file.     No Known Allergies   REVIEW OF SYSTEMS:  (Positives checked otherwise negative)  CARDIOVASCULAR:   [ ] chest pain,  [ ] chest pressure,  [ ] palpitations,  [ ] shortness of breath when laying flat,  [ ] shortness of breath with exertion,   [ ] pain in feet when walking,  [ ] pain in feet when laying flat, [ ] history of blood clot in veins (DVT),  [ ] history of phlebitis,  [ ] swelling in legs,  [ ] varicose  veins  PULMONARY:   [ ] productive cough,  [ ] asthma,  [ ] wheezing  NEUROLOGIC:   [x] weakness in arms or legs,  [x] numbness in arms or legs,  [ ] difficulty speaking or slurred speech,  [ ] temporary loss of vision in one eye,  [ ] dizziness  HEMATOLOGIC:   [ ] bleeding problems,  [ ] problems with blood clotting too easily  MUSCULOSKEL:   [ ] joint pain, [ ] joint swelling  GASTROINTEST:   [ ] vomiting blood,  [ ] blood in stool     GENITOURINARY:   [ ] burning with urination,  [ ] blood in urine  PSYCHIATRIC:   [ ] history of major depression  INTEGUMENTARY:   [ ] rashes,  [ ] ulcers  CONSTITUTIONAL:   [ ] fever,  [ ] chills   Physical Examination  Vitals:   03/13/16 1156 03/13/16 1230 03/13/16 1630 03/13/16 1730  BP: 144/85 133/92 137/92 150/87  Pulse:   70 71  Resp:      Temp:      TempSrc:      SpO2:   95% 100%  Weight:      Height:       Body mass index is 31.32   kg/m.  General: A&O x 3, WDWN  Head: [place]/AT  Ear/Nose/Throat: Hearing grossly intact, nares w/o erythema or drainage, oropharynx w/o Erythema/Exudate, Mallampati score: 3  Eyes: PERRLA, EOMI  Neck: Supple, no nuchal rigidity, no palpable LAD  Pulmonary: Sym exp, good air movt, CTAB, no rales, rhonchi, & wheezing  Cardiac: RRR, Nl S1, S2, no Murmurs, rubs or gallops  Vascular: Vessel Right Left  Radial Palpable Not Palpable  Brachial Palpable Faintly Palpable  Carotid Palpable, without bruit Palpable, without bruit  Aorta Not palpable N/A  Femoral Palpable Palpable  Popliteal Not palpable Not palpable  PT Palpable Palpable  DP Palpable Palpable   Gastrointestinal: soft, NTND, no G/R, no HSM, no masses, no CVAT B  Musculoskeletal: M/S 5/5 throughout except L arm unable test due to pain L axilla, L hand grip 1-2/5, obvious linear abrasion in left axilla extending to posterior shoulder, Extremities without ischemic changes, viable appear L hand  Neurologic: CN 2-12  grossly, intact Pain and light touch intact in extremities except L hand and forearm, Motor exam as listed above  Psychiatric: Judgment intact, Mood & affect appropriate for pt's clinical situation  Dermatologic: See M/S exam for extremity exam, no rashes otherwise noted  Lymph : No Cervical, Axillary, or Inguinal lymphadenopathy   Laboratory: CBC:    Component Value Date/Time   WBC 15.7 (H) 03/13/2016 1742   RBC 4.39 03/13/2016 1742   HGB 14.6 03/13/2016 1742   HCT 42.6 03/13/2016 1742   PLT 268 03/13/2016 1742   MCV 97.0 03/13/2016 1742   MCH 33.3 03/13/2016 1742   MCHC 34.3 03/13/2016 1742   RDW 14.6 03/13/2016 1742   LYMPHSABS 4.2 (H) 03/13/2016 1742   MONOABS 1.2 (H) 03/13/2016 1742   EOSABS 0.1 03/13/2016 1742   BASOSABS 0.1 03/13/2016 1742    BMP:    Component Value Date/Time   NA 139 03/13/2016 1456   K 3.9 03/13/2016 1456   CL 106 03/13/2016 1456   GLUCOSE 100 (H) 03/13/2016 1456   BUN 14 03/13/2016 1456   CREATININE 0.80 03/13/2016 1456    Coagulation: No results found for: INR, PROTIME No results found for: PTT  Lipids: No results found for: CHOL, TRIG, HDL, CHOLHDL, VLDL, LDLCALC, LDLDIRECT  Radiology: Dg Forearm Left  Result Date: 03/13/2016 CLINICAL DATA:  Injury.  Left forearm pain. EXAM: LEFT FOREARM - 2 VIEW COMPARISON:  None. FINDINGS: There is no evidence of fracture or other focal bone lesions. Soft tissues are unremarkable. IMPRESSION: Negative. Electronically Signed   By: David  Ormond M.D.   On: 03/13/2016 13:41   Ct Angio Up Extrem Left W &/or Wo Contast  Result Date: 03/13/2016 CLINICAL DATA:  Fell from scaffolding, left axillary injury with left arm and hand numbness and paresthesias. EXAM: CT ANGIOGRAPHY UPPER LEFT EXTREMITY TECHNIQUE: CT angiography technique performed of the left upper extremity a with IV contrast. Coronal and sagittal reconstructions performed. CONTRAST:  100 cc Isovue 370 COMPARISON:  None. FINDINGS: Imaging beginning  at the left chest wall, the peripheral left subclavian artery is patent. Left axillary artery demonstrates a focal area of of occlusion compatible with acute traumatic vascular injury consistent with a focal dissection and thrombosis. Peripheral to this, the brachial artery reconstitutes and is normal in appearance. At the elbow, the radial, interosseous, and ulnar arteries are patent into the forearm. Branches of the palmar arch into the hand are patent. No visualized distal emboli within the left upper extremity to the level of the wrist. No significant soft   tissue hematoma. No other musculoskeletal abnormality. Review of the MIP images confirms the above findings. 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 Left brachial artery is reconstituted supplying the radial, interosseous and ulnar arteries. These results were called by telephone at the time of interpretation on 03/13/2016 at 5:23 pm to Dr. TATYANA KIRICHENKO , who verbally acknowledged these results. Electronically Signed   By: M.  Shick M.D.   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 Pain in left shoulder joint down through elbow to forearm with numbness in left forearm and hand. Bruising in Left arm pit. EXAM: LEFT SHOULDER - 2+ VIEW COMPARISON:  None. FINDINGS: There is no evidence of fracture or dislocation. There is no evidence of arthropathy or other focal bone abnormality. Soft tissues are unremarkable. IMPRESSION: Negative. Electronically Signed   By: David  Ormond M.D.   On: 03/13/2016 13:40    I reviewed the LUE CTA, there is focal thrombosis of axillary artery without any associated hematoma and reconstitution of brachial artery with intact flow to radial and ulnar arteries.   Medical Decision Making  Oscar  Peterson is a 34 y.o. male who presents with: traction injury to left axillary artery with likely intimal disruption from dissection leading to thrombosis.   Pt will need emergent: exploration of L axilla, repair of L axillary artery, other indicated procedure.  The patient is aware that an interposition graft might be needed, which would be harvested from GSV. The risk, benefits, and alternative for the proposed operation were discussed with the patient.   The patient is aware the risks include but are not limited to: bleeding, infection, myocardial infarction, stroke, limb loss, nerve damage, need for additional procedures in the future, wound complications, and inability to complete the revascularization. The patient is aware of these risks and agreed to proceed.  The degree of numbness in forearm and hand suggest likely concomitant neurologic injury also.  Further evaluation of nerve function and ROM will have to wait reconstruction of the artery.  Thank you for allowing us to participate in this patient's care.   Oscar Kappes, MD [facility] [place] Office: [phone] Pager: [phone]  03/13/2016, 6:05 PM  

## 2016-03-13 NOTE — Anesthesia Preprocedure Evaluation (Addendum)
Anesthesia Evaluation  Patient identified by MRN, date of birth, ID band Patient awake    Reviewed: Allergy & Precautions, NPO status , Patient's Chart, lab work & pertinent test results  Airway Mallampati: II  TM Distance: >3 FB Neck ROM: Full    Dental no notable dental hx. (+) Teeth Intact, Dental Advisory Given   Pulmonary neg pulmonary ROS, Current Smoker,    Pulmonary exam normal breath sounds clear to auscultation       Cardiovascular negative cardio ROS Normal cardiovascular exam Rhythm:Regular Rate:Normal     Neuro/Psych negative neurological ROS  negative psych ROS   GI/Hepatic negative GI ROS, Neg liver ROS,   Endo/Other  negative endocrine ROS  Renal/GU negative Renal ROS     Musculoskeletal negative musculoskeletal ROS (+)   Abdominal   Peds  Hematology negative hematology ROS (+)   Anesthesia Other Findings   Reproductive/Obstetrics negative OB ROS                            Anesthesia Physical Anesthesia Plan  ASA: II and emergent  Anesthesia Plan: General   Post-op Pain Management:    Induction: Intravenous, Rapid sequence and Cricoid pressure planned  Airway Management Planned: Oral ETT  Additional Equipment:   Intra-op Plan:   Post-operative Plan: Extubation in OR  Informed Consent: I have reviewed the patients History and Physical, chart, labs and discussed the procedure including the risks, benefits and alternatives for the proposed anesthesia with the patient or authorized representative who has indicated his/her understanding and acceptance.   Dental advisory given  Plan Discussed with: CRNA  Anesthesia Plan Comments:         Anesthesia Quick Evaluation

## 2016-03-13 NOTE — ED Notes (Signed)
Family at bedside. 

## 2016-03-13 NOTE — ED Notes (Signed)
MD at bedside. 

## 2016-03-14 DIAGNOSIS — S4990XA Unspecified injury of shoulder and upper arm, unspecified arm, initial encounter: Secondary | ICD-10-CM | POA: Diagnosis present

## 2016-03-14 DIAGNOSIS — S45009A Unspecified injury of axillary artery, unspecified side, initial encounter: Secondary | ICD-10-CM | POA: Diagnosis present

## 2016-03-14 LAB — SURGICAL PCR SCREEN
MRSA, PCR: NEGATIVE
Staphylococcus aureus: NEGATIVE

## 2016-03-14 LAB — CBC
HCT: 37.9 % — ABNORMAL LOW (ref 39.0–52.0)
HEMOGLOBIN: 12.8 g/dL — AB (ref 13.0–17.0)
MCH: 33.1 pg (ref 26.0–34.0)
MCHC: 33.8 g/dL (ref 30.0–36.0)
MCV: 97.9 fL (ref 78.0–100.0)
PLATELETS: 227 10*3/uL (ref 150–400)
RBC: 3.87 MIL/uL — ABNORMAL LOW (ref 4.22–5.81)
RDW: 14.8 % (ref 11.5–15.5)
WBC: 16.7 10*3/uL — ABNORMAL HIGH (ref 4.0–10.5)

## 2016-03-14 LAB — BASIC METABOLIC PANEL
Anion gap: 8 (ref 5–15)
BUN: 11 mg/dL (ref 6–20)
CHLORIDE: 105 mmol/L (ref 101–111)
CO2: 27 mmol/L (ref 22–32)
CREATININE: 0.87 mg/dL (ref 0.61–1.24)
Calcium: 8.7 mg/dL — ABNORMAL LOW (ref 8.9–10.3)
GFR calc Af Amer: >60 mL/min (ref >60)
GFR calc non Af Amer: >60 mL/min (ref >60)
GLUCOSE: 111 mg/dL — AB (ref 65–99)
Potassium: 3.7 mmol/L (ref 3.5–5.1)
SODIUM: 140 mmol/L (ref 135–145)

## 2016-03-14 MED ORDER — SODIUM CHLORIDE 0.9 % IV SOLN
INTRAVENOUS | Status: DC
  Administered 2016-03-14: 04:00:00 via INTRAVENOUS

## 2016-03-14 MED ORDER — ACETAMINOPHEN 325 MG PO TABS: 325.0000 mg | ORAL_TABLET | ORAL | Status: DC | PRN

## 2016-03-14 MED ORDER — POLYETHYLENE GLYCOL 3350 17 G PO PACK: 17.0000 g | PACK | Freq: Every day | ORAL | Status: DC | PRN

## 2016-03-14 MED ORDER — GUAIFENESIN-DM 100-10 MG/5ML PO SYRP: 15.0000 mL | ORAL_SOLUTION | ORAL | Status: DC | PRN

## 2016-03-14 MED ORDER — OXYCODONE-ACETAMINOPHEN 5-325 MG PO TABS
1.0000 | ORAL_TABLET | ORAL | Status: DC | PRN
  Administered 2016-03-14 – 2016-03-15 (×6): 2 via ORAL
  Filled 2016-03-14 (×6): qty 2

## 2016-03-14 MED ORDER — METOPROLOL TARTRATE 5 MG/5ML IV SOLN: 2.0000 mg | INTRAVENOUS | Status: DC | PRN

## 2016-03-14 MED ORDER — ONDANSETRON HCL 4 MG/2ML IJ SOLN: 4.0000 mg | Freq: Four times a day (QID) | INTRAMUSCULAR | Status: DC | PRN

## 2016-03-14 MED ORDER — HYDROMORPHONE HCL 1 MG/ML IJ SOLN: 0.5000 mg | INTRAMUSCULAR | Status: DC | PRN

## 2016-03-14 MED ORDER — FLEET ENEMA 7-19 GM/118ML RE ENEM: 1.0000 | ENEMA | Freq: Once | RECTAL | Status: DC | PRN

## 2016-03-14 MED ORDER — ENOXAPARIN SODIUM 40 MG/0.4ML ~~LOC~~ SOLN
40.0000 mg | SUBCUTANEOUS | Status: DC
  Administered 2016-03-14: 40 mg via SUBCUTANEOUS
  Filled 2016-03-14 (×2): qty 0.4

## 2016-03-14 MED ORDER — MAGNESIUM SULFATE 2 GM/50ML IV SOLN
2.0000 g | Freq: Every day | INTRAVENOUS | Status: DC | PRN
  Filled 2016-03-14: qty 50

## 2016-03-14 MED ORDER — HYDROMORPHONE HCL 1 MG/ML IJ SOLN
INTRAMUSCULAR | Status: AC
  Filled 2016-03-14: qty 1

## 2016-03-14 MED ORDER — ACETAMINOPHEN 325 MG RE SUPP: 325.0000 mg | RECTAL | Status: DC | PRN

## 2016-03-14 MED ORDER — SODIUM CHLORIDE 0.9 % IV SOLN: 500.0000 mL | Freq: Once | INTRAVENOUS | Status: DC | PRN

## 2016-03-14 MED ORDER — DOCUSATE SODIUM 100 MG PO CAPS: 100.0000 mg | ORAL_CAPSULE | Freq: Every day | ORAL | Status: DC

## 2016-03-14 MED ORDER — HYDRALAZINE HCL 20 MG/ML IJ SOLN: 5.0000 mg | INTRAMUSCULAR | Status: DC | PRN

## 2016-03-14 MED ORDER — LABETALOL HCL 5 MG/ML IV SOLN: 10.0000 mg | INTRAVENOUS | Status: DC | PRN

## 2016-03-14 MED ORDER — ALUM & MAG HYDROXIDE-SIMETH 200-200-20 MG/5ML PO SUSP: 15.0000 mL | ORAL | Status: DC | PRN

## 2016-03-14 MED ORDER — PANTOPRAZOLE SODIUM 40 MG PO TBEC
40.0000 mg | DELAYED_RELEASE_TABLET | Freq: Every day | ORAL | Status: DC
  Administered 2016-03-14: 40 mg via ORAL
  Filled 2016-03-14: qty 1

## 2016-03-14 MED ORDER — PHENOL 1.4 % MT LIQD: 1.0000 | OROMUCOSAL | Status: DC | PRN

## 2016-03-14 MED ORDER — DEXTROSE 5 % IV SOLN
1.5000 g | Freq: Two times a day (BID) | INTRAVENOUS | Status: AC
  Administered 2016-03-14 (×2): 1.5 g via INTRAVENOUS
  Filled 2016-03-14 (×2): qty 1.5

## 2016-03-14 MED ORDER — BISACODYL 10 MG RE SUPP: 10.0000 mg | Freq: Every day | RECTAL | Status: DC | PRN

## 2016-03-14 MED ORDER — POTASSIUM CHLORIDE CRYS ER 20 MEQ PO TBCR: 20.0000 meq | EXTENDED_RELEASE_TABLET | Freq: Every day | ORAL | Status: DC | PRN

## 2016-03-14 NOTE — Evaluation (Signed)
Physical Therapy Evaluation Patient Details Name: Oscar Peterson MRN: [id number] DOB: [birth year] Today's Date: 8/20/20172   History of Present Illness  pt is a 35 y/o male with h/o back surgery admitted after falling from scaffolding and catching himself by L armpit to rail.  Immediately noted sharp pain L arm pit followed by numbness elbow to hand.  Post Left axillary exploration, evacuation of left axillary hematoma and axillary artery repair.  Clinical Impression  Pt admitted with/for injury to axillary artery and likely the nerve plexus L UE.  Pt currently limited functionally due to the problems listed below.  (see problems list.)   Relayed to pt basic limitations and self ROM exercise for L UE until he sees an orthopedist in [place].  No further acute needs at this time, but expect pt will need extensive rehab in the near future in an outpatient setting.     Follow Up Recommendations Other (comment);Outpatient PT (based on recommendations from [facility] orthopedist)    Equipment Recommendations  None recommended by PT    Recommendations for Other Services       Precautions / Restrictions Precautions Precautions: None Required Braces or Orthoses: Sling      Mobility  Bed Mobility Overal bed mobility: Independent                Transfers Overall transfer level: Independent                  Ambulation/Gait Ambulation/Gait assistance: Independent Ambulation Distance (Feet): 200 Feet Assistive device: None Gait Pattern/deviations: WFL(Within Functional Limits)   Gait velocity interpretation: at or above normal speed for age/gender General Gait Details: A little limp R LE from sore knee from fall  Stairs            Wheelchair Mobility    Modified Rankin (Stroke Patients Only)       Balance Overall balance assessment: No apparent balance deficits (not formally assessed)                                            Pertinent Vitals/Pain Pain Assessment: Faces Faces Pain Scale: Hurts little more Pain Location: r knee Pain Descriptors / Indicators: Sore Pain Intervention(s): Monitored during session    Home Living Family/patient expects to be discharged to:: Private residence Living Arrangements: Spouse/significant other Available Help at Discharge: Family;Available PRN/intermittently Type of Home: House Home Access: Stairs to enter       Home Equipment: None      Prior Function Level of Independence: Independent         Comments: working as Psychiatric nurse [employer]        Extremity/Trunk Assessment   Upper Extremity Assessment: LUE deficits/detail       LUE Deficits / Details: can flex/ext fingers, all fingers are numb, weak supination   Lower Extremity Assessment: Overall WFL for tasks assessed         Communication   Communication: No difficulties  Cognition Arousal/Alertness: Awake/alert Behavior During Therapy: WFL for tasks assessed/performed Overall Cognitive Status: Within Functional Limits for tasks assessed                      General Comments General comments (skin integrity, edema, etc.): discussed minimizing motion on L shoulder to constraint of sling.  No trying to raise arm over head.  Continue  with finger and wrist movement.  Due to no knowlegde of where bicep rupture, advised not extending the elbow just yet    Exercises        Assessment/Plan    PT Assessment All further PT needs can be met in the next venue of care  PT Diagnosis     PT Problem List Decreased strength;Decreased range of motion;Impaired sensation  PT Treatment Interventions     PT Goals (Current goals can be found in the Care Plan section) Acute Rehab PT Goals PT Goal Formulation: All assessment and education complete, DC therapy    Frequency     Barriers to discharge        Co-evaluation               End of Session   Activity  Tolerance: Patient tolerated treatment well Patient left:  (up in the room) Nurse Communication: Mobility status         Time: 1706-1730 PT Time Calculation (min) (ACUTE ONLY): 24 min   Charges:   PT Evaluation $PT Eval Low Complexity: 1 Procedure PT Treatments $Gait Training: 8-22 mins   PT G Codes:        Yaasir Menken, Tessie Fass 03/14/2016, 5:43 PM 03/14/2016  Donnella Sham, PT [phone]  (pager)

## 2016-03-14 NOTE — Progress Notes (Signed)
Pt arrived to unit from 3S.  Telemetry applied and CCMD notified.  Pt oriented to room including call light and telephone.  Current plan of care discussed with Pt and all questions answered.  Will cont to monitor.

## 2016-03-14 NOTE — Progress Notes (Addendum)
   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  Pt would [place] get [place].  PT/OT evaluation to help determine rehab needs  Pt would like to get home ASAP [facility]) so see if we can make all arrangement by Monday   Subjective  - 1 Day Post-Op  Still numb from elbow down, still painful ROM, PO rx adequate so far to control pain   Objective Vitals:   03/14/16 0045 03/14/16 0150 03/14/16 0245 03/14/16 0700  BP:  140/81  118/67  Pulse: 62 82  (!) 54  Resp: 11 13  14   Temp:  98.3 F (36.8 C)  98.4 F (36.9 C)  TempSrc:    Oral  SpO2: 92% 95%  95%  Weight:   191 lb 14.4 oz (87 kg)   Height:   5\' 7"  (1.702 m)     Intake/Output Summary (Last 24 hours) at 03/14/16 0903 Last data filed at 03/14/16 0600  Gross per 24 hour  Intake          1746.25 ml  Output              375 ml  Net          1371.25 ml    PULM  CTAB CV  RRR GI  soft, NTND VASC  L arm in sling, incisions in axilla and L thigh bandaged no active bleeding, palpable strong radial pulse, ROM not tested  Laboratory CBC    Component Value Date/Time   WBC 16.7 (H) 03/14/2016 0059   HGB 12.8 (L) 03/14/2016 0059   HCT 37.9 (L) 03/14/2016 0059   PLT 227 03/14/2016 0059    BMET    Component Value Date/Time   NA 140 03/14/2016 0059   K 3.7 03/14/2016 0059   CL 105 03/14/2016 0059   CO2 27 03/14/2016 0059   GLUCOSE 111 (H) 03/14/2016 0059   BUN 11 03/14/2016 0059   CREATININE 0.87 03/14/2016 0059   CALCIUM 8.7 (L) 03/14/2016 0059   GFRNONAA >60 03/14/2016 0059   GFRAA >60 03/14/2016 0059    Leonides SakeBrian Chen, MD [facility] [place] Office: [phone] Pager:  [phone]  03/14/2016, 9:03 AM

## 2016-03-15 ENCOUNTER — Encounter (HOSPITAL_COMMUNITY): Payer: Self-pay | Admitting: Vascular Surgery

## 2016-03-15 MED ORDER — OXYCODONE-ACETAMINOPHEN 5-325 MG PO TABS: 1.0000 | ORAL_TABLET | ORAL | 0 refills | Status: AC | PRN

## 2016-03-15 NOTE — Progress Notes (Addendum)
    Progress Note  Subjective  - POD #2  Left arm numb around mid forearm to fingertips. Pain controlled on po pain meds.   Objective Vitals:   03/14/16 2125 03/15/16 0534  BP: 128/67 (!) 144/72  Pulse: (!) 51 (!) 57  Resp: 18 18  Temp: 98.2 F (36.8 C) 98.6 F (37 C)    Intake/Output Summary (Last 24 hours) at 03/15/16 0737 Last data filed at 03/15/16 0500  Gross per 24 hour  Intake             1190 ml  Output              200 ml  Net              990 ml   Left axillary incision c/d/i. No hematoma or ecchymosis Left shoulder abduction intact, elbow flexion and extension intact, ROM wrist intact.  Palpable left radial pulse. Left fingers numb.  Left thigh saphenectomy site clean.   Assessment/Planning: 35 y.o. male is s/p: left axillary exploration, repair of axilla with interposition graft, evac axillary hematoma 2 Days Post-Op   Pain is well controlled on po pain meds. 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  PT recommending outpatient PT.  Will make referral to [place] in [place].  D/c home today.   Raymond GurneyKimberly A Trinh 03/15/2016 7:37 AM --  Laboratory CBC    Component Value Date/Time   WBC 16.7 (H) 03/14/2016 0059   HGB 12.8 (L) 03/14/2016 0059   HCT 37.9 (L) 03/14/2016 0059   PLT 227 03/14/2016 0059    BMET    Component Value Date/Time   NA 140 03/14/2016 0059   K 3.7 03/14/2016 0059   CL 105 03/14/2016 0059   CO2 27 03/14/2016 0059   GLUCOSE 111 (H) 03/14/2016 0059   BUN 11 03/14/2016 0059   CREATININE 0.87 03/14/2016 0059   CALCIUM 8.7 (L) 03/14/2016 0059   GFRNONAA >60 03/14/2016 0059   GFRAA >60 03/14/2016 0059    COAG No results found for: INR, PROTIME No results found for: PTT  Antibiotics Anti-infectives    Start     Dose/Rate Route Frequency Ordered Stop   03/14/16 0200  cefUROXime (ZINACEF) 1.5 g in dextrose 5 % 50 mL IVPB     1.5 g 100 mL/hr over 30 Minutes Intravenous Every 12 hours  03/14/16 0034 03/14/16 1508       Maris BergerKimberly Trinh, PA-C [facility] Office: [phone] Pager: [phone] 03/15/2016 7:37 AM   Addendum  I have independently interviewed and examined the patient, and I agree with the physician assistant's findings.  Inc c/d/i in axilla and left thigh.  Anesthesia in forearm improving per patient.  Pt looks more comfortable and left shoulder movement somewhat improved.  - Pt will need ORTHO evaluation.  Make more sense [place] - PT[place] then [place] setup [place] - Once ORTHO appt arrange, ok to d/c home  - Will need to setup VASC follow up in [place] also for long-term surveillance   Leonides SakeBrian Chen, MD [facility] [place] Office: [phone] Pager: [phone]  03/15/2016, 8:04 AM  CT scan was burned and given to patient to take to his orthopedic appointment [facility] [place] [place].  Main office number [phone].

## 2016-03-15 NOTE — Anesthesia Postprocedure Evaluation (Signed)
Anesthesia Post Note  Patient: Oscar BandaStanley Peterson  Procedure(s) Performed: Procedure(s) (LRB): Interposition Graft Repair Axillary Artery using Greater Saphenous Vein Graft from Left Thigh; Evacuation of Hematoma Left Axilla (Left)  Patient location during evaluation: PACU Anesthesia Type: General Level of consciousness: sedated and patient cooperative Pain management: pain level controlled Vital Signs Assessment: post-procedure vital signs reviewed and stable Respiratory status: spontaneous breathing Cardiovascular status: stable Anesthetic complications: no    Last Vitals:  Vitals:   03/14/16 2125 03/15/16 0534  BP: 128/67 (!) 144/72  Pulse: (!) 51 (!) 57  Resp: 18 18  Temp: 36.8 C 37 C    Last Pain:  Vitals:   03/15/16 0534  TempSrc: Oral  PainSc:                  Lewie LoronJohn Myles Mallicoat

## 2016-03-15 NOTE — Progress Notes (Signed)
Occupational Therapy Treatment Patient Details Name: Oscar Peterson MRN: [id number] DOB: [birth year] Today's Date: 03/15/2016    History of present illness pt is a 35 y/o male with h/o back surgery admitted after falling from scaffolding and catching himself by L armpit to rail.  Immediately noted sharp pain L arm pit followed by numbness elbow to hand.  Post Left axillary exploration, evacuation of left axillary hematoma and axillary artery repair. and repair of anterior bicep muscle belly.    OT comments  Per Dr. Bridgett Larsson, no ROM limitations LUE. Pt with muscle belly involvement only and no tendon involvement per Dr. Bridgett Larsson. Pt with apparent brachial plexus dysfunction. Poor L hand  intrinsic movements.  Pt educated on HEP for LUE within pain tolerance. Given additional hand strengthening exercises/activities to complete once able to tolerate as pt unsure if he will be able to follow up with  outpt OT. Pt verbalized understanding of HEP and precautions of no lifting/pulling or pushing with LUE at this time and to wear sling when ambulating and sleeping for support. Recommend pt follow up with ortho.   Follow Up Recommendations  Outpatient OT;Supervision - Intermittent    Equipment Recommendations       Recommendations for Other Services      Precautions / Restrictions Precautions Precautions: Other (comment) (no pulling/pushing /lifting LUE) Required Braces or Orthoses: Sling Restrictions Weight Bearing Restrictions: Yes Other Position/Activity Restrictions: none noted. would limit pulling pushing or lifting       Mobility Bed Mobility Overal bed mobility: Independent                Transfers Overall transfer level: Independent                    Balance Overall balance assessment: No apparent balance deficits (not formally assessed)                                 ADL     Functional mobility during ADLs: Independent General ADL Comments: Completed  education regarding compensatory techniques for ADL      Vision                     Perception     Praxis      Cognition   Behavior During Therapy: WFL for tasks assessed/performed Overall Cognitive Status: Within Functional Limits for tasks assessed                       Extremity/Trunk Assessment  Upper Extremity Assessment Upper Extremity Assessment: LUE deficits/detail LUE Deficits / Details: gross composite flexion but unable to touch all digits to palm. extensor lag of @ 20 degrees L ring and little fingers. Unable to achieve intrinsic -/+ positions. Ab/adduction weak. Ablet to supinate/pronate. wrist ext/flex and ulnar/radial deviation WFL. Placed call to MD regarding shoulder and elbow ROM LUE Sensation: decreased light touch (copmlains of numbness worse in fingertips. numbness starts @) LUE Coordination: decreased fine motor;decreased gross motor   Lower Extremity Assessment Lower Extremity Assessment: Overall WFL for tasks assessed   Cervical / Trunk Assessment Cervical / Trunk Assessment: Normal    Exercises Shoulder Exercises Shoulder Flexion: AAROM;Left;10 reps Shoulder ABduction: AAROM;Left;10 reps Shoulder External Rotation: AAROM;Left;10 reps Elbow Flexion: AAROM;Left;10 reps Elbow Extension: AAROM;Left;10 reps Wrist Flexion: AROM;Left;10 reps Wrist Extension: AROM;Left;10 reps Digit Composite Flexion: AROM;Left;10 reps Composite Extension: AROM;Left;10 reps Neck Flexion: AROM;5 reps Neck  Extension: AROM;5 reps Neck Lateral Flexion - Right: AROM;5 reps Neck Lateral Flexion - Left: AROM;5 reps Hand Exercises Forearm Supination: AAROM;Left;5 reps Forearm Pronation: AAROM;Left;5 reps Wrist Flexion: AROM;Left;5 reps Wrist Extension: AROM;Left;5 reps Wrist Ulnar Deviation: AROM;Left;5 reps Wrist Radial Deviation: AROM;Left;5 reps Digit Composite Flexion: AROM;Left;10 reps Composite Extension: AROM;Left;10 reps Digit Composite  Abduction: AROM;Left;5 reps Digit Composite Adduction: AROM;Left;5 reps Thumb Abduction: AAROM;Left;5 reps Thumb Adduction: AAROM;Left;5 reps Donning/doffing shirt without moving shoulder: Modified independent Method for sponge bathing under operated UE: Modified independent Donning/doffing sling/immobilizer: Modified independent Correct positioning of sling/immobilizer: Modified independent ROM for elbow, wrist and digits of operated UE: Modified independent Sling wearing schedule (on at all times/off for ADL's): Modified independent Proper positioning of operated UE when showering: Modified independent Positioning of UE while sleeping: Modified independent   Shoulder Instructions Shoulder Instructions Donning/doffing shirt without moving shoulder: Modified independent Method for sponge bathing under operated UE: Modified independent Donning/doffing sling/immobilizer: Modified independent Correct positioning of sling/immobilizer: Modified independent ROM for elbow, wrist and digits of operated UE: Modified independent Sling wearing schedule (on at all times/off for ADL's): Modified independent Proper positioning of operated UE when showering: Modified independent Positioning of UE while sleeping: Modified independent     General Comments      Pertinent Vitals/ Pain       Pain Assessment: Faces Pain Score: 4  Faces Pain Scale: Hurts little more Pain Location: LUE Pain Descriptors / Indicators: Numbness Pain Intervention(s): Limited activity within patient's tolerance;Repositioned  Home Living Family/patient expects to be discharged to:: Private residence Living Arrangements: Spouse/significant other Available Help at Discharge: Family;Available PRN/intermittently Type of Home: House Home Access: Stairs to enter                     Home Equipment: None          Prior Functioning/Environment Level of Independence: Independent        Comments: working as Network engineer 3X/week     Progress Toward Goals  OT Goals(current goals can now be found in the care plan section)  Progress towards OT goals: Goals met/education completed, patient discharged from OT  Acute Rehab OT Goals Patient Stated Goal: to be able to use my arm OT Goal Formulation: With patient Time For Goal Achievement: 03/22/16 Potential to Achieve Goals: Good ADL Goals Pt/caregiver will Perform Home Exercise Program: Left upper extremity;Increased ROM;With written HEP provided (no pulling/pushing/lifting;fine motor/hand stregthening)  Plan Discharge plan remains appropriate    Co-evaluation                 End of Session     Activity Tolerance Patient tolerated treatment well   Patient Left in bed;with call bell/phone within reach   Nurse Communication Mobility status        Time: 1010-1035 OT Time Calculation (min): 25 min  Charges: OT General Charges $OT Visit: 1 Procedure OT Treatments $Self Care/Home Management : 8-22 mins $Therapeutic Exercise: 8-22 mins  Khristina Janota,HILLARY 03/15/2016, 10:51 AM   Maurie Boettcher, OTR/L  [phone] 03/15/2016

## 2016-03-15 NOTE — Progress Notes (Signed)
Occupational Therapy Evaluation Patient Details Name: Oscar BandaStanley Peterson MRN: [id number] DOB: June 30, 1981 Today's Date: 03/15/2016    History of Present Illness pt is a 35 y/o male with h/o back surgery admitted after falling from scaffolding and catching himself by L armpit to rail.  Immediately noted sharp pain L arm pit followed by numbness elbow to hand.  Post Left axillary exploration, evacuation of left axillary hematoma and axillary artery repair. and repair of anterior bicep muscle belly.    Clinical Impression   PTA, pt independent with ADL and mobility. Attempted to contact MD regarding bicep repair and precautions regarding axillary repair. Per op note, unsuccessful anterior muscle belly repair - repair of fascia only. Will return prior to D/C today to complete education on home education program as pt does not have insurance and he states his ability to follow up with outpatient therapy may be limited.     Follow Up Recommendations  Outpatient OT    Equipment Recommendations     none  Recommendations for Other Services       Precautions / Restrictions Precautions Precautions: None Required Braces or Orthoses: Sling Restrictions Weight Bearing Restrictions: Yes Other Position/Activity Restrictions: none noted. would limit pulling pushing or lifting      Mobility Bed Mobility Overal bed mobility: Independent                Transfers Overall transfer level: Independent                    Balance Overall balance assessment: No apparent balance deficits (not formally assessed)                                          ADL Overall ADL's : Needs assistance/impaired Eating/Feeding: Set up;Sitting   Grooming: Set up;Sitting   Upper Body Bathing: Minimal assitance   Lower Body Bathing: Set up   Upper Body Dressing : Minimal assistance   Lower Body Dressing: Set up   Toilet Transfer: Independent   Toileting- Clothing Manipulation  and Hygiene: Modified independent       Functional mobility during ADLs: Independent       Vision     Perception     Praxis      Pertinent Vitals/Pain Pain Assessment: Faces Pain Score: 4  Faces Pain Scale: Hurts little more Pain Location: LUE Pain Descriptors / Indicators: Numbness;Discomfort Pain Intervention(s): Limited activity within patient's tolerance     Hand Dominance Right   Extremity/Trunk Assessment Upper Extremity Assessment Upper Extremity Assessment: LUE deficits/detail LUE Deficits / Details: gross composite flexion but unable to touch all digits to palm. extensor lag of @ 20 degrees L ring and little fingers. Unable to achieve intrinsic -/+ positions. Ab/adduction weak. Ablet to supinate/pronate. wrist ext/flex and ulnar/radial deviation WFL. Placed call to MD regarding shoulder and elbow ROM LUE Sensation: decreased light touch (copmlains of numbness worse in fingertips. numbness starts @) LUE Coordination: decreased fine motor;decreased gross motor   Lower Extremity Assessment Lower Extremity Assessment: Overall WFL for tasks assessed   Cervical / Trunk Assessment Cervical / Trunk Assessment: Normal   Communication Communication Communication: No difficulties   Cognition Arousal/Alertness: Awake/alert Behavior During Therapy: WFL for tasks assessed/performed Overall Cognitive Status: Within Functional Limits for tasks assessed                     General Comments  Exercises Exercises: Hand exercises     Shoulder Instructions      Home Living Family/patient expects to be discharged to:: Private residence Living Arrangements: Spouse/significant other Available Help at Discharge: Family;Available PRN/intermittently Type of Home: House Home Access: Stairs to enter                     Home Equipment: None          Prior Functioning/Environment Level of Independence: Independent        Comments: working as  Psychologist, clinicalspray foam isulator    OT Diagnosis: Generalized weakness;Acute pain   OT Problem List: Decreased strength;Decreased range of motion;Decreased knowledge of precautions;Impaired sensation;Impaired UE functional use;Pain;Increased edema   OT Treatment/Interventions: Self-care/ADL training;Therapeutic exercise;Therapeutic activities;Patient/family education    OT Goals(Current goals can be found in the care plan section) Acute Rehab OT Goals Patient Stated Goal: to be able to use my arm OT Goal Formulation: With patient Time For Goal Achievement: 03/22/16 Potential to Achieve Goals: Good  OT Frequency: Min 3X/week   Barriers to D/C:            Co-evaluation              End of Session Nurse Communication: Mobility status  Activity Tolerance: Patient tolerated treatment well Patient left: in bed;with call bell/phone within reach   Time: 4540-98110815-0834 OT Time Calculation (min): 19 min Charges:  OT General Charges $OT Visit: 1 Procedure OT Evaluation $OT Eval Moderate Complexity: 1 Procedure G-Codes:    Ginelle Bays,HILLARY 03/15/2016, 9:12 AM  Oscar Peterson, OTR/L  [phone] 03/15/2016

## 2016-03-17 NOTE — Discharge Summary (Signed)
Discharge Summary   Oscar Peterson Dec 19, 1980 35 y.o. male  [id number]  Admission Date: 03/13/2016  Discharge Date: 03/15/2016  Physician: Oscar SakeBrian Cleva Camero, MD  Admission Diagnosis: Left axillary artery injury s/p fall  HPI:   This is a 35 y.o. male who presents with chief complaint: severe pain left armpit. The patient is right hand dominant.  This patient fell onto a scaffolding rail around 11 am, catching it with his left armpit.  He held onto the scaffolding while it proceeded to collapses.  He notes severe sharp pain in left arm pit with numbness from elbow down to hand.  Pt denies any prior left arm injuries.   Hospital Course:  The patient was admitted to the hospital and taken to the operating room on 03/13/2016 and underwent: 1. Left axillary exploration 2. Evacuation of left axillary hematoma 3. Repair of axillary artery with reversed left greater saphenous vein interposition graft  At the completion of the case, the patient had biphasic brachial, radial and ulnar artery signals.    The patient tolerated the procedure well and was transported to the PACU in stable condition.   POD 1: The patient had some numbness from the elbow down with painful ROM. He had a strong palpable left radial pulse.   POD 2: He continued to have numbness around mid forearm to fingertips. He had a palpable left radial pulse. His ROM and pain were improved. His axillary and left thigh incisions were clean and intact without hematoma. He was evaluated by PT and OT who recommended outpatient PT. He was set [place] with an [place] in [place] as that was where he resides. He was discharged home on POD 2 in good condition.   CBC    Component Value Date/Time   WBC 16.7 (H) 03/14/2016 0059   RBC 3.87 (L) 03/14/2016 0059   HGB 12.8 (L) 03/14/2016 0059   HCT 37.9 (L) 03/14/2016 0059   PLT 227 03/14/2016 0059   MCV 97.9 03/14/2016 0059   MCH 33.1 03/14/2016 0059   MCHC 33.8 03/14/2016 0059   RDW  14.8 03/14/2016 0059   LYMPHSABS 4.2 (H) 03/13/2016 1742   MONOABS 1.2 (H) 03/13/2016 1742   EOSABS 0.1 03/13/2016 1742   BASOSABS 0.1 03/13/2016 1742    BMET    Component Value Date/Time   NA 140 03/14/2016 0059   K 3.7 03/14/2016 0059   CL 105 03/14/2016 0059   CO2 27 03/14/2016 0059   GLUCOSE 111 (H) 03/14/2016 0059   BUN 11 03/14/2016 0059   CREATININE 0.87 03/14/2016 0059   CALCIUM 8.7 (L) 03/14/2016 0059   GFRNONAA >60 03/14/2016 0059   GFRAA >60 03/14/2016 0059     Discharge Instructions:   The patient is discharged to home with extensive instructions on wound care and progressive ambulation.  They are instructed not to drive or perform any heavy lifting until returning to see the physician in his office.  Discharge Instructions    Ambulatory referral to Physical Therapy    Complete by:  As directed   Call MD for:  redness, tenderness, or signs of infection (pain, swelling, bleeding, redness, odor or green/yellow discharge around incision site)    Complete by:  As directed   Call MD for:  severe or increased pain, loss or decreased feeling  in affected limb(s)    Complete by:  As directed   Call MD for:  temperature >100.5    Complete by:  As directed   Discharge wound care:  Complete by:  As directed   You may shower. Wash left armpit and left groin wounds daily with soap and water and pat dry. Do not apply deodorant to left armpit. You do not have to reapply a dressing to your incisions. The glue on your incisions will peel off on its own. Do not peel it off.   Driving Restrictions    Complete by:  As directed   No driving until cleared by physical therapy.   Increase activity slowly    Complete by:  As directed   Wear sling if left arm feels more comfortable in it.   Lifting restrictions    Complete by:  As directed   No lifting until cleared by physical therapy.   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 Axillary artery injury 03/14/2016  . Axilla injury 03/14/2016   History reviewed. No pertinent past medical history.     Medication List    TAKE these medications   oxyCODONE-acetaminophen 5-325 MG tablet Commonly known as:  PERCOCET/ROXICET Take 1-2 tablets by mouth every 4 (four) hours as needed for moderate pain.       Percocet #30 No Refill  Disposition: Home  Patient's condition: is Good  Follow up: 1. Dr. Imogene Peterson in 2 weeks 2. [place]oped[place] in [place]   Oscar Peterson, [place]-C [facility] [phone] 03/17/2016  4:14 PM    Addendum  I have independently interviewed and examined the patient, and I agree with the physician assistant's discharge summary.  Intraoperative findings included: 1. 2-3 cm of intimal disruption and thrombosis of left distal axillary artery 2. Frank rupture of proximal head of bicep with frank rupture of muscle and sheath 3. No obvious transection of median nerve of medial antecubital sensory trunk.  The right axillary artery was repaired with an interposition graft of 3-4 mm L GSV vein sewn in a reversed configuration.  The valves were also lysed (see OR Note for details).  The distal axillary artery was ~5 mm with proximal brachial artery ~4 mm, so there was a slight size mismatch proximally.  The defect in the biceps muscle was too large to repair, so I reapproximated the sheath overlying the muscle instead.  On discharge, the patient had a strong radial pulse.  Some improvement in forearm sensation.  Motor strength in hand and forearm was unchanged from pre-op.  Pain left shoulder was improved.  A full motor examination was never possible in this patient due to the severe pain experiences with even passive ROM testing.  - [place] and would prefer follow up arranged with a practice in that city. - Ortho  follow up arranged for this week in [place]. - Will similarly arrange arrange Vascular Surgery follow up in [place].  Oscar SakeBrian Ramona Slinger, MD [facility] [place] Office: [phone] Pager: [phone]  03/18/2016, 10:36 AM

## 2016-04-05 ENCOUNTER — Telehealth: Payer: Self-pay

## 2016-04-05 NOTE — Telephone Encounter (Signed)
rec'd phone call from Tiffany, pt's. S.O.  Reported the pt. has not had any f/u appt. since his surgery on 8/19.  Reported the surgeon that [place] was [place] to in [place] was rude, and would not evaluate him.  Reported "the pt.'s left thigh incision is starting to open-up in one area, and has increased swelling, redness and warmth, at the lower end of the incision."  Denied fever/ chills.  Is requesting to f/u with Dr. Imogene Burnhen.  Due to Dr. Imogene Burnhen on vacation this week, advised will bring him in to see another provider, due to incisional problems.  Advised will have a Scheduler call with appt. information.  Agreed with plan.

## 2016-04-06 ENCOUNTER — Encounter: Payer: Self-pay | Admitting: Vascular Surgery

## 2016-04-06 NOTE — Telephone Encounter (Signed)
I spoke w/ the patient and scheduled an appt w/ him for Wed 04/06/16 at 2pm for him to see PA on Dr.Early's schedule. He will call back if he cannot arrange transportation to this appt. awt

## 2016-04-07 ENCOUNTER — Ambulatory Visit: Payer: Medicaid Other | Admitting: Vascular Surgery

## 2016-04-08 ENCOUNTER — Encounter: Payer: Self-pay | Admitting: Vascular Surgery

## 2016-04-08 ENCOUNTER — Ambulatory Visit (INDEPENDENT_AMBULATORY_CARE_PROVIDER_SITE_OTHER): Payer: Worker's Compensation | Admitting: Vascular Surgery

## 2016-04-08 ENCOUNTER — Other Ambulatory Visit: Payer: Self-pay

## 2016-04-08 VITALS — BP 139/93 | HR 66 | Temp 98.7°F | Resp 97 | Ht 67.0 in | Wt 189.0 lb

## 2016-04-08 DIAGNOSIS — M62838 Other muscle spasm: Secondary | ICD-10-CM

## 2016-04-08 DIAGNOSIS — S45002A Unspecified injury of axillary artery, left side, initial encounter: Secondary | ICD-10-CM

## 2016-04-08 DIAGNOSIS — S45002D Unspecified injury of axillary artery, left side, subsequent encounter: Secondary | ICD-10-CM

## 2016-04-08 MED ORDER — METHOCARBAMOL 1000 MG/10ML IJ SOLN: 500.0000 mg | Freq: Every evening | INTRAMUSCULAR | Status: AC | PRN

## 2016-04-08 MED ORDER — METHOCARBAMOL 500 MG PO TABS: 500.0000 mg | ORAL_TABLET | Freq: Every evening | ORAL | 0 refills | Status: AC | PRN

## 2016-04-08 MED ORDER — METHOCARBAMOL 500 MG PO TABS: 500.0000 mg | ORAL_TABLET | Freq: Every evening | ORAL | 0 refills | Status: DC | PRN

## 2016-04-08 MED ORDER — TRAMADOL HCL 50 MG PO TABS: 50.0000 mg | ORAL_TABLET | Freq: Every evening | ORAL | 0 refills | Status: AC | PRN

## 2016-04-08 NOTE — Progress Notes (Signed)
Referring Physician: Dr. Dalene Seltzer (ED [facility]) 03/13/2016  Patient name: Oscar Peterson MRN: [id number] DOB: 1980-11-09 Sex: male  REASON FOR CONSULT: left axillary artery dissection with thrombosis  HPI: Oscar Peterson is a 35 y.o. male who presented with traumatic injury of left axillary artery after fall onto left arm pit from a scaffolding while at work here in [place].     s/p  1. Left axillary exploration 2. Evacuation of left axillary hematoma 3. Repair of axillary artery with reversed left greater saphenous vein interposition graft He is here today for a follow up visit.  He was not seen by an Orthopedic physician near home for his left upper extremity brachial plexus injury.  He has not yet been referred to physical or occupational therapy at this time either.  He is also concerned about the left thigh vein harvest site not healing well.  [place] near [place].  He has difficulty with motion and pain in the left UE, especially at night time and has trouble with sleep.  He is right hand dominant.    No past medical history on file. Past Surgical History:  Procedure Laterality Date  . AXILLARY-FEMORAL BYPASS GRAFT Left 03/13/2016   Procedure: Interposition Graft Repair Axillary Artery using Greater Saphenous Vein Graft from Left Thigh; Evacuation of Hematoma Left Axilla;  Surgeon: Fransisco Hertz, MD;  Location: [facility] OR;  Service: Vascular;  Laterality: Left;  . BACK SURGERY      No family history on file.  SOCIAL HISTORY: Social History   Social History  . Marital status: Single    Spouse name: N/A  . Number of children: N/A  . Years of education: N/A   Occupational History  . Not on file.   Social History Main Topics  . Smoking status: Current Every Day Smoker  . Smokeless tobacco: Never Used     Comment: 1/2 pk per day  . Alcohol use No  . Drug use: No  . Sexual activity: Not on file   Other Topics Concern  . Not on file   Social History Narrative    . No narrative on file    No Known Allergies  Current Outpatient Prescriptions  Medication Sig Dispense Refill  . naproxen (NAPROSYN) 500 MG tablet Take 500 mg by mouth 2 (two) times daily with a meal.    . oxyCODONE-acetaminophen (PERCOCET/ROXICET) 5-325 MG tablet Take 1-2 tablets by mouth every 4 (four) hours as needed for moderate pain. 30 tablet 0  . methocarbamol (ROBAXIN) 500 MG tablet Take 1 tablet (500 mg total) by mouth at bedtime and may repeat dose one time if needed. 30 tablet 0  . traMADol (ULTRAM) 50 MG tablet Take 1 tablet (50 mg total) by mouth at bedtime as needed and may repeat dose one time if needed. 30 tablet 0   Current Facility-Administered Medications  Medication Dose Route Frequency Provider Last Rate Last Dose  . methocarbamol (ROBAXIN) 500 mg in dextrose 5 % 50 mL IVPB  500 mg Intravenous QHS PRN,MR X 1 [name] M Collins, PA-C        ROS:   General:  No weight loss, Fever, chills  HEENT: No recent headaches, no nasal bleeding, no visual changes, no sore throat  Neurologic: No dizziness, blackouts, seizures. No recent symptoms of stroke or mini- stroke. No recent episodes of slurred speech, or temporary blindness.  Cardiac: No recent episodes of chest pain/pressure, no shortness of breath at rest.  No shortness of breath  with exertion.  Denies history of atrial fibrillation or irregular heartbeat  Vascular: No history of rest pain in feet.  No history of claudication.  No history of non-healing ulcer, No history of DVT   Pulmonary: No home oxygen, no productive cough, no hemoptysis,  No asthma or wheezing  Musculoskeletal:  [ ]  Arthritis, [ ]  Low back pain,  [x} Joint pain  Hematologic:No history of hypercoagulable state.  No history of easy bleeding.  No history of anemia  Gastrointestinal: No hematochezia or melena,  No gastroesophageal reflux, no trouble swallowing  Urinary: []  chronic Kidney disease, [ ]  on HD - [ ]  MWF or [ ]  TTHS, [ ]  Burning with  urination, [ ]  Frequent urination, [ ]  Difficulty urinating;   Skin: No rashes  Psychological: No history of anxiety,  No history of depression   Physical Examination  Vitals:   04/08/16 1409  BP: (!) 139/93  Pulse: 66  Resp: (!) 97  Temp: 98.7 F (37.1 C)  TempSrc: Oral  SpO2: 97%  Weight: 189 lb (85.7 kg)  Height: 5\' 7"  (1.702 m)    Body mass index is 29.6 kg/m.  General:  Alert and oriented, no acute distress HEENT: Normal Neck: No bruit or JVD Pulmonary: Clear to auscultation bilaterally Cardiac: Regular Rate and Rhythm without murmur Abdomen: Soft, non-tender, non-distended, no mass, no scars Skin: Left hand numbness, dry peeling skin, hypersensitivity to touch.  Left thigh vein harvest site superficial incision separation. Extremity Pulses:  2+ radial, brachial, bilaterally Musculoskeletal: Left UE weakness with abduction 40 degrees, forward flexion 60 degrees, grip 4/5, internal external rotation 50%. Neurologic: Upper and lower extremity motor 5/5 and symmetric     ASSESSMENT:   left axillary artery injury with thrombosis Left brachial plexus injury   PLAN:   He has palpable brachial,  radial and ulnar pulses.  The axillary incision is healing well. The left thigh incision has a superficial separation without erythema or edema.  We recommend dial soap washes daily and keep it dry between bathing.  He has significant decreed motion and strength in the left UE.  I gave him a prescription for PT and OT to Eval and treat for brachial Plexus injury.  We are also wanting to refer [facility] for brachial Plexus Injury.  F/U with Dr. Imogene Burnhen in 4 weeks Out of work until cleared by PT/OT and Ortho.  No lifting over 1 LBs.  I gave him a prescription for Robaxin 500 mg 1-2 QHS # 30 and Tramadol 1-2 PRN QHS for pain.  He was instructed to continue with tylenol and or anti inflammatories during the day for discomfort.    I also showed his  wall walking exercises and table slides for a home exercise program.     [name], [name] [facility] PA-C [facility] [place]  He was seen today in our office in conjunction with Dr. Darrick PennaFields.     History and exam findings as above. Patient of Dr. Imogene Burnhen.  He had an interposition graft repair of his axillary artery. He has significant disability in his left upper extremity with limited range of motion. His bypass graft is patent. We will refer him for further evaluation of his possible neurologic injury at [facility].

## 2016-05-10 ENCOUNTER — Encounter: Payer: Self-pay | Admitting: Vascular Surgery

## 2016-05-13 NOTE — Progress Notes (Deleted)
    Postoperative Visit   History of Present Illness  Cottie BandaStanley Swarey is a 35 y.o. year old male who presents for postoperative follow-up for: L ax exploration, evac hematoma, repair of axillary artery with L rGSV (Date: 03/13/16).  The patient's wounds are *** healed.  The patient notes *** resolution of lower extremity symptoms.  The patient is *** able to complete their activities of daily living.  The patient's current symptoms are: ***.  For VQI Use Only  PRE-ADM LIVING: {VQI Pre-admission Living:20973}  AMB STATUS: {VQI Ambulatory Status:20974}   Physical Examination  There were no vitals filed for this visit.  RUE: Incisions are *** healed, wounds are *** healed, pedal pulses are ***  LLE: incision ***.   Medical Decision Making  Cottie BandaStanley Diesel is a 35 y.o. year old male who presents s/p Exploration of L axilla, evac hematoma, interposition repair of injured axillary artery.   The patient's bypass incisions are *** healing appropriately with resolution of pre-operative symptoms. I discussed in depth with the patient the nature of atherosclerosis, and emphasized the importance of maximal medical management including strict control of blood pressure, blood glucose, and lipid levels, obtaining regular exercise, and cessation of smoking.  The patient is aware that without maximal medical management the underlying atherosclerotic disease process will progress, limiting the benefit of any interventions. The patient's surveillance will included ABI and bypass duplex studies which will be completed in: *** months, at which time the patient will be re-evaluated.   I emphasized the importance of routine surveillance of the patient's bypass, as the vascular surgery literature emphasize the improved patency possible with assisted primary patency procedures versus secondary patency procedures. The patient agrees to participate in their maximal medical care and routine surveillance.  Thank  you for allowing us to participate in this patient's care.  Leonides SakeBrian Chen, MD, FACS [facility] [place] Office: [phone] Pager: [phone]

## 2016-05-14 ENCOUNTER — Ambulatory Visit: Payer: Self-pay | Admitting: Vascular Surgery

## 2016-06-09 ENCOUNTER — Encounter: Payer: Self-pay | Admitting: Vascular Surgery

## 2016-06-10 NOTE — Progress Notes (Signed)
    Postoperative Visit   History of Present Illness  Oscar Peterson is a 35 y.o. year old male who presents for postoperative follow-up for:  1. Left axillary exploration 2. Evacuation of left axillary hematoma 3. Repair of axillary artery with reversed left greater saphenous vein interposition graft (Date: 03/13/16).    This patient was referred to [place]o practice in [place] lives.  Unclear why he was unable to obtain care there.  He was referred by Dr. Darrick PennaFields to [facility] for further [facility]o evaluation.  Again, there appears to be some issues with obtaining care.  The patient has successful [place]  He continues to has significant limitations in his ADLs from his injuries though he has recovered some use of his L arm and hand.  He notes sensation is improved in his lower arm and hand    For VQI Use Only  PRE-ADM LIVING: Home  AMB STATUS: Ambulatory  Physical Examination  Vitals:   06/11/16 1125  BP: 132/82  Pulse: 72  Resp: 18  Temp: 99.1 F (37.3 C)   LUE: L axillary incision healed, palpable brachial and radial pulse, hand grip 3/5, AROM in LUE limited   Medical Decision Making  Oscar Peterson is a 35 y.o. year old male who presents s/p L ax exploration, evac L ax hematoma, repair of ax artery with L rGSV interposition graft for traumatic fall onto L axilla   Unfortunately, it appears that both the [place] [facility] and [facility] appear unwilling to take on this patient due to likely workman's compensation issues.  Meanwhile, there is no one managing this patient's ORTHO needs related to both his likely nerve injuries and bicep tear related to his fall.  I have asked Dr. Rennis ChrisSupple to evaluate this patient as he was highly recommended by his peers for management of shoulder injuries.    His vascular injuries have been addressed, so now his neuromuscular issues are causing significant limitations for  him.  Given the median nerve and sensory trunk were both superficial to the axillary artery segment injured by the fall, I would expect a significant injury to both nerve groups though the nerve sheaths were intact upon inspection.  The patient's has already recovered some motor and nerve function from immediately after his injury, so this is consistent with my expectation that the nerves should regenerate with some time.  The extent of the recovery remains uncertain.  Additionally, one belly of the bicep appeared to be torn off the bony attachments intraoperatively.  I would defer to Dr. Rennis ChrisSupple any further evaluation or intervention needed for that injury.  The patient is already following up with a vascular surgical practice in [place], so I will be available as needed.  Thank you for allowing us to participate in this patient's care.  Leonides SakeBrian Chen, MD, FACS [facility] [place] Office: [phone] Pager: [phone]

## 2016-06-11 ENCOUNTER — Ambulatory Visit (INDEPENDENT_AMBULATORY_CARE_PROVIDER_SITE_OTHER): Payer: Worker's Compensation | Admitting: Vascular Surgery

## 2016-06-11 ENCOUNTER — Encounter: Payer: Self-pay | Admitting: Vascular Surgery

## 2016-06-11 VITALS — BP 132/82 | HR 72 | Temp 99.1°F | Resp 18 | Ht 67.0 in | Wt 195.0 lb

## 2016-06-11 DIAGNOSIS — S46112D Strain of muscle, fascia and tendon of long head of biceps, left arm, subsequent encounter: Secondary | ICD-10-CM

## 2016-06-11 DIAGNOSIS — S4992XD Unspecified injury of left shoulder and upper arm, subsequent encounter: Secondary | ICD-10-CM

## 2016-06-11 DIAGNOSIS — S46212D Strain of muscle, fascia and tendon of other parts of biceps, left arm, subsequent encounter: Secondary | ICD-10-CM

## 2016-06-11 DIAGNOSIS — S45002D Unspecified injury of axillary artery, left side, subsequent encounter: Secondary | ICD-10-CM

## 2017-07-31 IMAGING — DX DG FOREARM 2V*L*
2 series · 2 of 2 positions shown · non-contrast
Comparison: None.

CLINICAL DATA: Injury.  Left forearm pain.

EXAM:
LEFT FOREARM - 2 VIEW

[forearm ap]
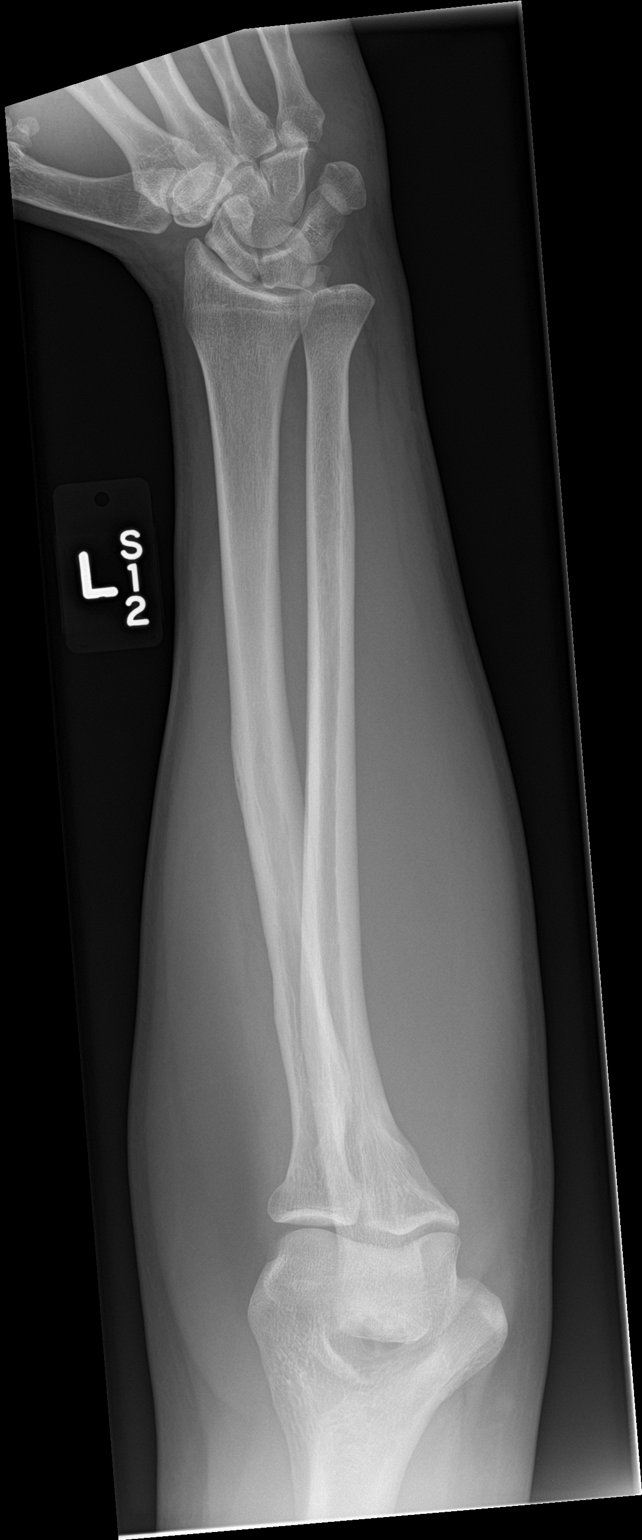

[forearm lat]
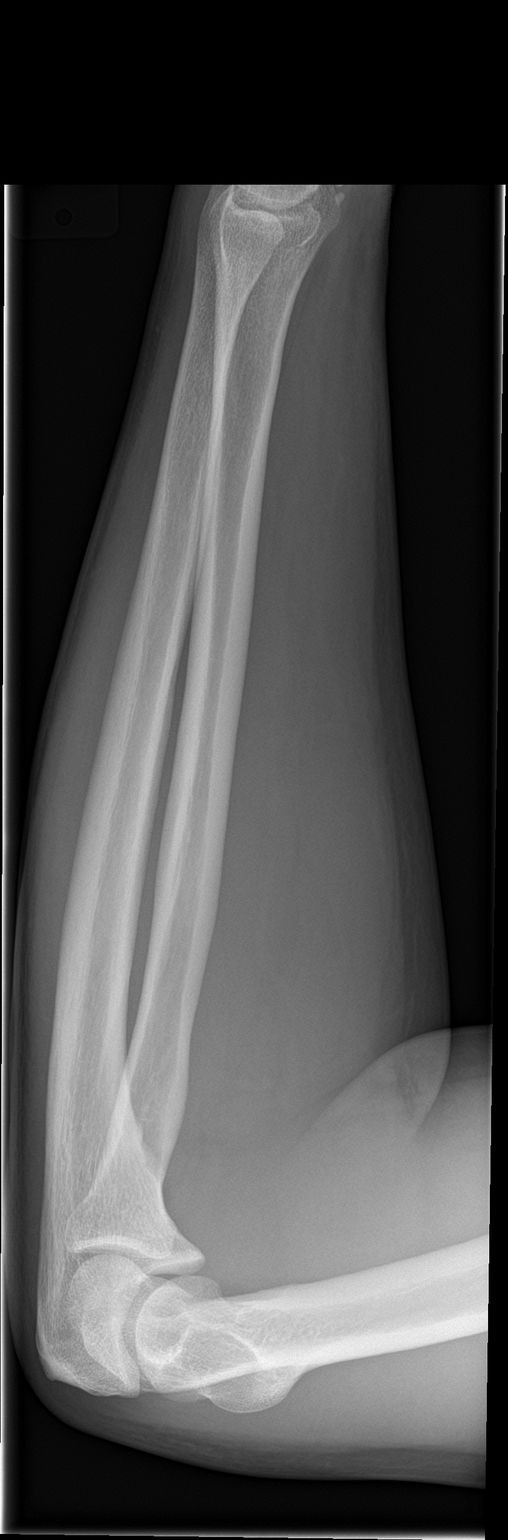

[2 of 2 positions shown; findings below may reference images not displayed]

FINDINGS: There is no evidence of fracture or other focal bone lesions. Soft
tissues are unremarkable.
IMPRESSION: Negative.

## 2017-07-31 IMAGING — CT CT ANGIO EXTREM UP*L*
1 of 5 series · 15 of 36 positions shown, 19 images · IV contrast (isovue)
Comparison: None.

CLINICAL DATA: Fell from scaffolding, left axillary injury with
left arm and hand numbness and paresthesias.

EXAM:
CT ANGIOGRAPHY UPPER LEFT EXTREMITY
TECHNIQUE: CT angiography technique performed of the left upper extremity a
with IV contrast. Coronal and sagittal reconstructions performed.
CONTRAST:  100 cc Isovue 370

[Series 5: upper ext cta 3.0 i30f 3 · axial · 0.48mm/px · z∈[-770,-77]mm · 15 of 258 slices shown, 19 images]
[im 18/258  soft-tissue]
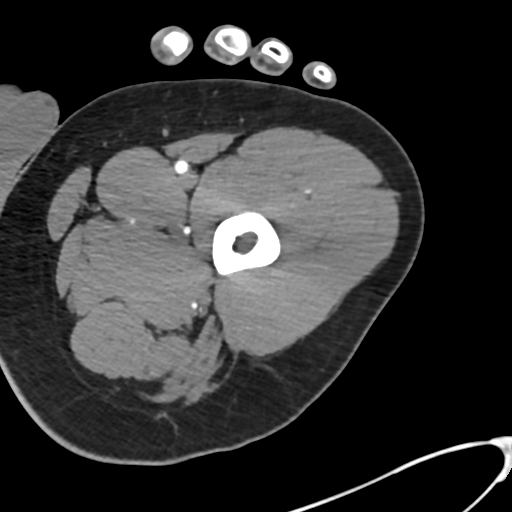
[im 18/258  bone]
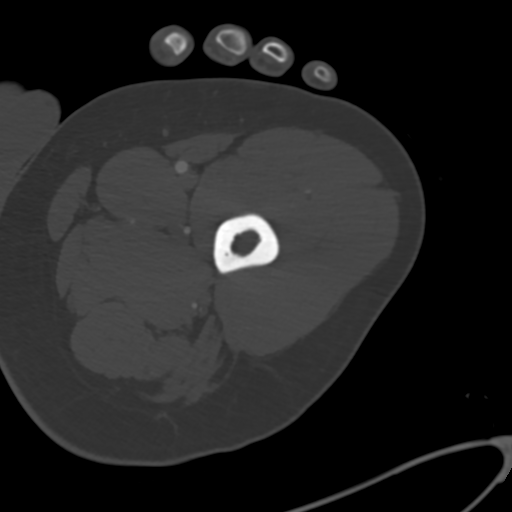
[im 36/258  soft-tissue]
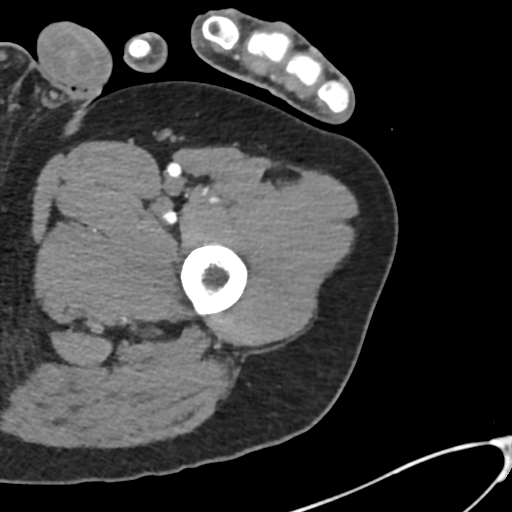
[im 54/258  soft-tissue]
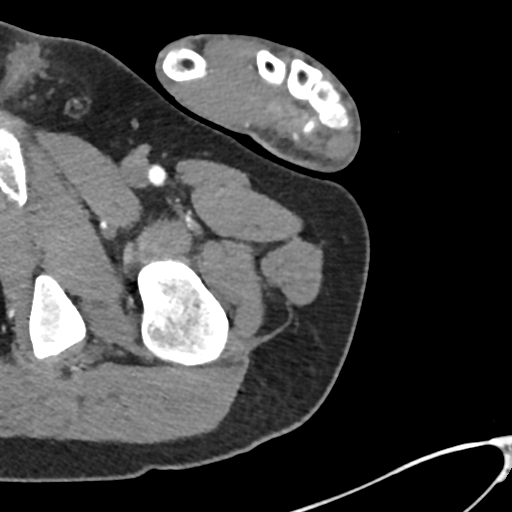
[im 71/258  soft-tissue]
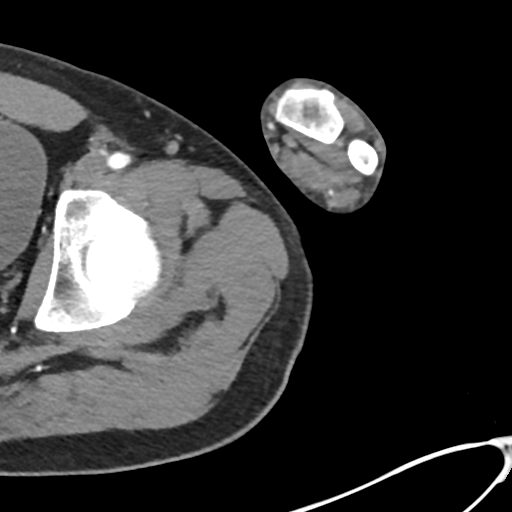
[im 89/258  soft-tissue]
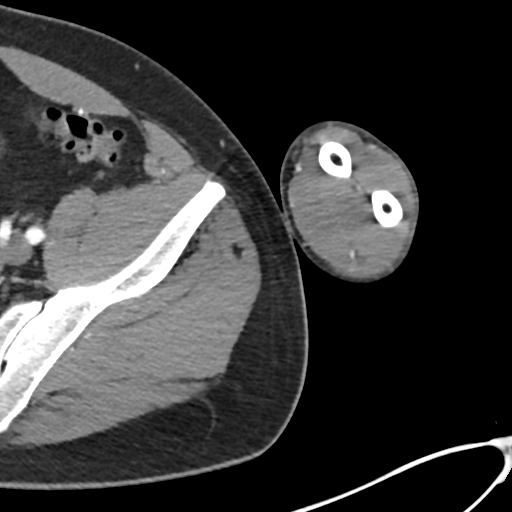
[im 107/258  soft-tissue]
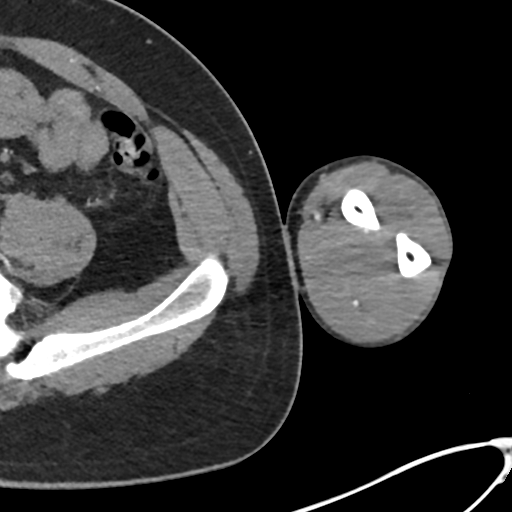
[im 133/258  soft-tissue]
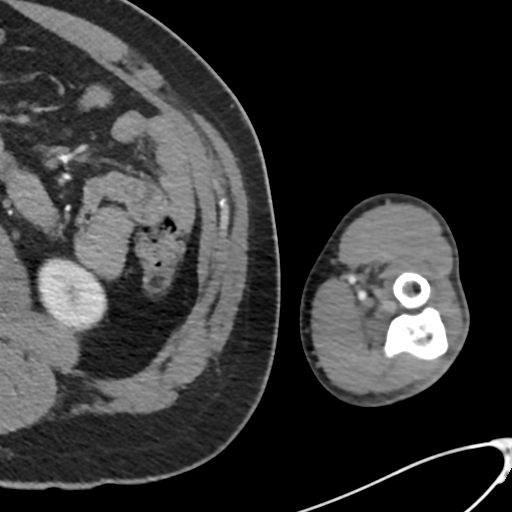
[im 151/258  soft-tissue]
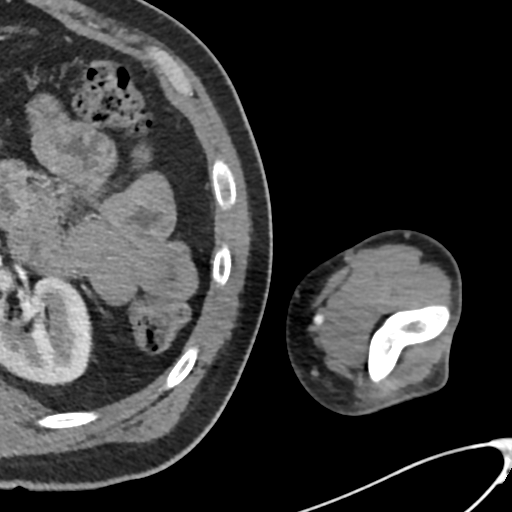
[im 169/258  soft-tissue]
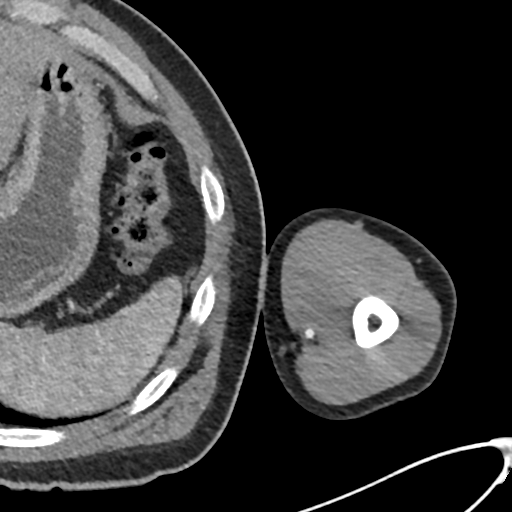
[im 169/258  bone]
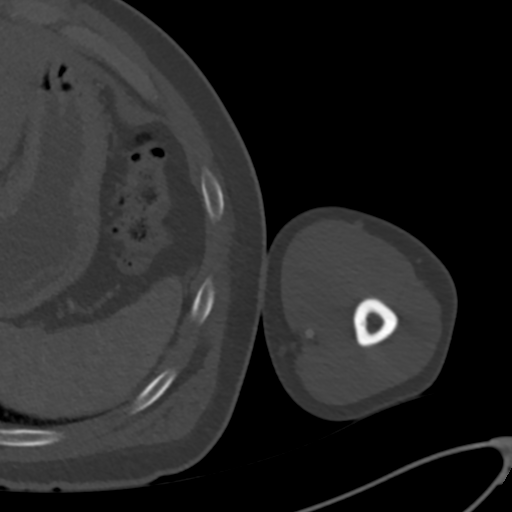
[im 187/258  soft-tissue]
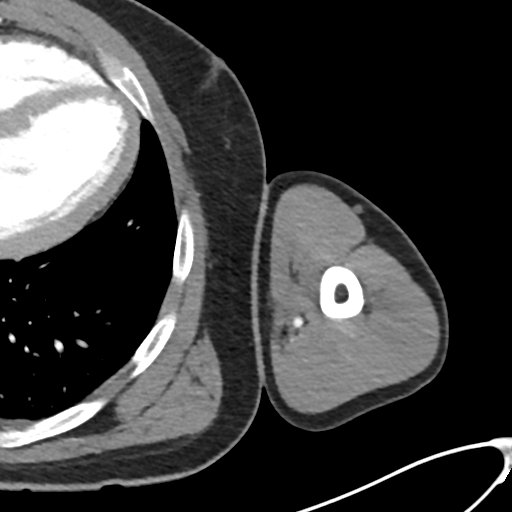
[im 204/258  soft-tissue]
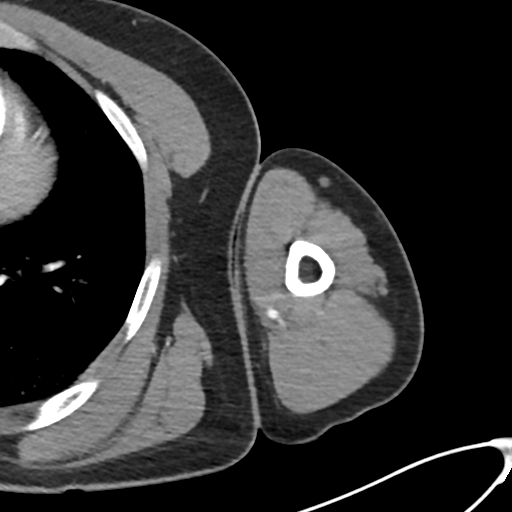
[im 222/258  soft-tissue]
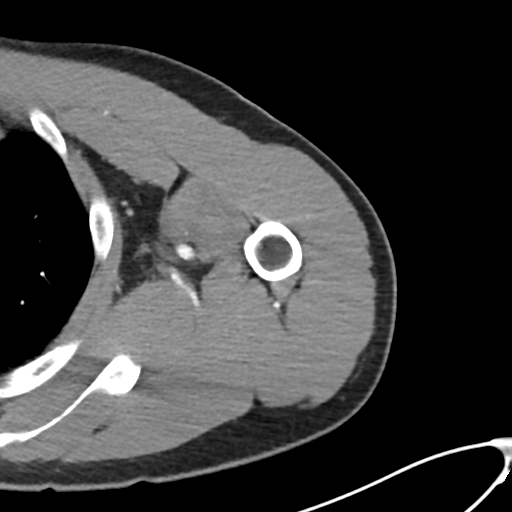
[im 222/258  lung]
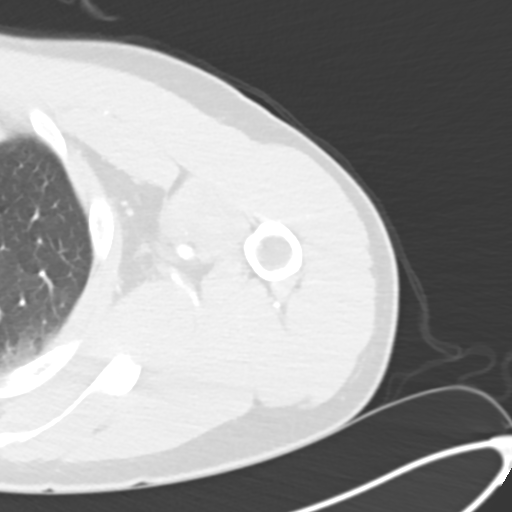
[im 231/258  lung]
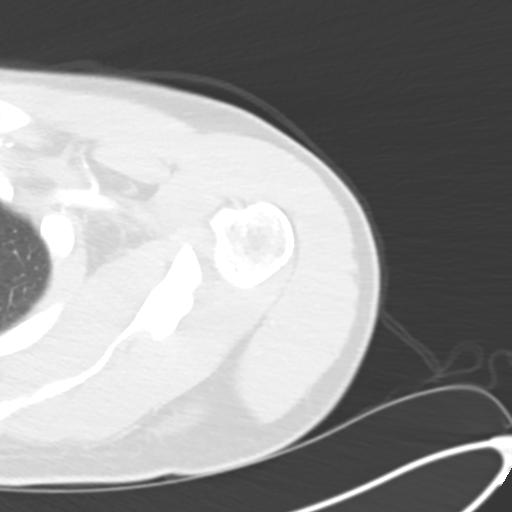
[im 240/258  soft-tissue]
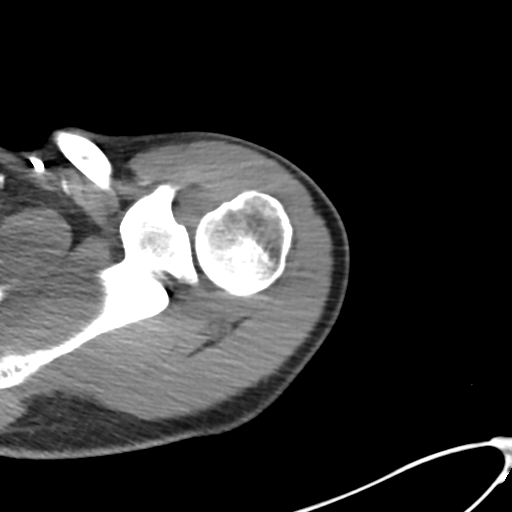
[im 240/258  lung]
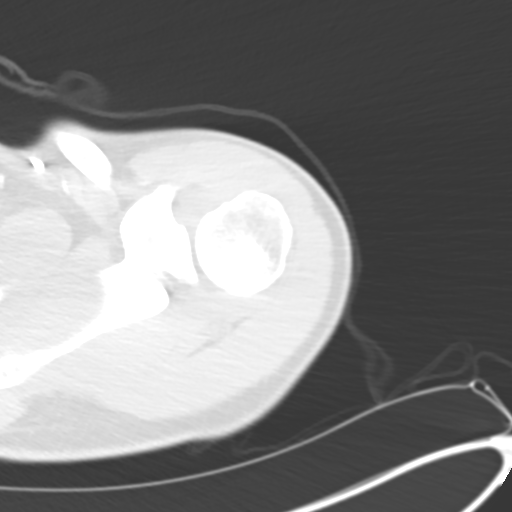
[im 249/258  lung]
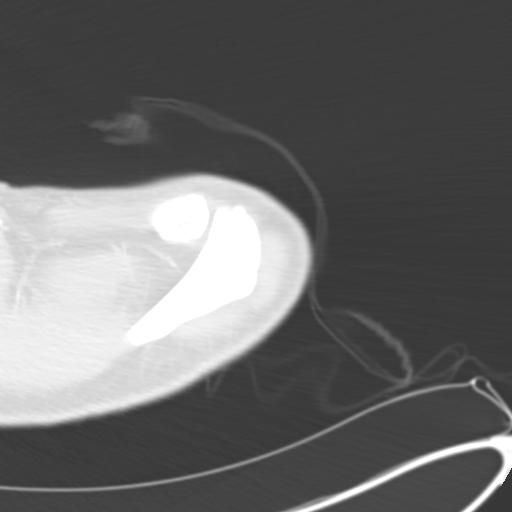

[15 of 36 positions shown; findings below may reference images not displayed]

FINDINGS: Imaging beginning at the left chest wall, the peripheral left
subclavian artery is patent. Left axillary artery demonstrates a
focal area of of occlusion compatible with acute traumatic vascular
injury consistent with a focal dissection and thrombosis. Peripheral
to this, the brachial artery reconstitutes and is normal in
appearance. At the elbow, the radial, interosseous, and ulnar
arteries are patent into the forearm. Branches of the palmar arch
into the hand are patent. No visualized distal emboli within the
left upper extremity to the level of the wrist.

No significant soft tissue hematoma. No other musculoskeletal
abnormality.

Review of the MIP images confirms the above findings.
IMPRESSION: Left axillary artery acute traumatic vascular injury with a focal
dissection and associated thrombosis.

Left brachial artery is reconstituted supplying the radial,
interosseous and ulnar arteries.

These results were called by telephone at the time of interpretation
on 03/13/2016 at [DATE] to Dr. A RODRIGUEZ R GOTAY , who verbally
acknowledged these results.
